# Patient Record
Sex: Female | Born: 1980
Health system: Southern US, Community
[De-identification: ages and names within clinical notes are randomized; demographics above are authoritative.]

## PROBLEM LIST (undated history)

## (undated) DIAGNOSIS — E785 Hyperlipidemia, unspecified: Secondary | ICD-10-CM

## (undated) DIAGNOSIS — Z8669 Personal history of other diseases of the nervous system and sense organs: Secondary | ICD-10-CM

## (undated) DIAGNOSIS — I10 Essential (primary) hypertension: Secondary | ICD-10-CM

## (undated) DIAGNOSIS — K219 Gastro-esophageal reflux disease without esophagitis: Secondary | ICD-10-CM

## (undated) HISTORY — DX: Personal history of other diseases of the nervous system and sense organs: Z86.69

## (undated) HISTORY — DX: Hyperlipidemia, unspecified: E78.5

## (undated) HISTORY — DX: Essential (primary) hypertension: I10

## (undated) HISTORY — DX: Gastro-esophageal reflux disease without esophagitis: K21.9

---

## 2010-11-18 HISTORY — PX: CHOLECYSTECTOMY: SHX55

## 2011-04-11 ENCOUNTER — Observation Stay: Payer: Self-pay | Admitting: Emergency Medicine

## 2011-08-15 ENCOUNTER — Ambulatory Visit: Payer: Self-pay

## 2011-11-19 HISTORY — PX: GASTRIC BYPASS: SHX52

## 2012-04-24 ENCOUNTER — Ambulatory Visit: Payer: Self-pay | Admitting: Bariatrics

## 2012-05-11 ENCOUNTER — Ambulatory Visit: Payer: Self-pay | Admitting: Bariatrics

## 2012-05-18 ENCOUNTER — Ambulatory Visit: Payer: Self-pay | Admitting: Bariatrics

## 2012-07-21 ENCOUNTER — Ambulatory Visit: Payer: Self-pay | Admitting: Bariatrics

## 2012-07-28 ENCOUNTER — Inpatient Hospital Stay: Payer: Self-pay | Admitting: Bariatrics

## 2012-07-28 LAB — PREGNANCY, URINE: Pregnancy Test, Urine: NEGATIVE m[IU]/mL

## 2012-07-29 LAB — CBC WITH DIFFERENTIAL/PLATELET
Basophil #: 0 10*3/uL (ref 0.0–0.1)
HCT: 40.6 % (ref 35.0–47.0)
Lymphocyte #: 0.9 10*3/uL — ABNORMAL LOW (ref 1.0–3.6)
Lymphocyte %: 5.4 %
MCHC: 33.5 g/dL (ref 32.0–36.0)
MCV: 92 fL (ref 80–100)
Monocyte %: 4.3 %
Neutrophil #: 14.8 10*3/uL — ABNORMAL HIGH (ref 1.4–6.5)
RDW: 12.8 % (ref 11.5–14.5)

## 2012-07-29 LAB — BASIC METABOLIC PANEL
Anion Gap: 7 (ref 7–16)
BUN: 8 mg/dL (ref 7–18)
Calcium, Total: 8.7 mg/dL (ref 8.5–10.1)
Chloride: 105 mmol/L (ref 98–107)
EGFR (Non-African Amer.): >60
Glucose: 138 mg/dL — ABNORMAL HIGH (ref 65–99)
Osmolality: 278 (ref 275–301)

## 2012-07-29 LAB — ALBUMIN: Albumin: 3.4 g/dL (ref 3.4–5.0)

## 2012-07-29 LAB — PHOSPHORUS: Phosphorus: 1.8 mg/dL — ABNORMAL LOW (ref 2.5–4.9)

## 2012-07-29 LAB — MAGNESIUM: Magnesium: 2 mg/dL

## 2012-08-28 ENCOUNTER — Ambulatory Visit: Payer: Self-pay | Admitting: General Practice

## 2012-09-18 ENCOUNTER — Ambulatory Visit: Payer: Self-pay | Admitting: General Practice

## 2012-11-18 HISTORY — PX: TONSILLECTOMY AND ADENOIDECTOMY: SUR1326

## 2012-12-25 ENCOUNTER — Ambulatory Visit: Payer: Self-pay | Admitting: Bariatrics

## 2013-04-08 IMAGING — RF DG UGI W/O KUB
8 series · 15 of 24 positions shown · non-contrast
Comparison: None

REASON FOR EXAM: abd pain Eval ulcer
COMMENTS:

PROCEDURE:     FL  - FL UPPER GI  - December 25, 2012  [DATE]
RESULT:     Indication: Abdominal pain

[Series 25: fluoro_barium 2fps_bw · 0.17mm/px · 2 of 3 frames shown (1 of 8)]
[frame 1/3]
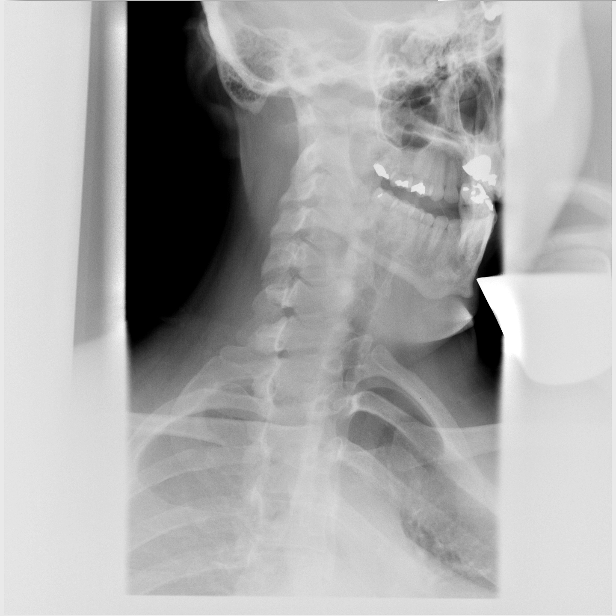
[frame 3/3]
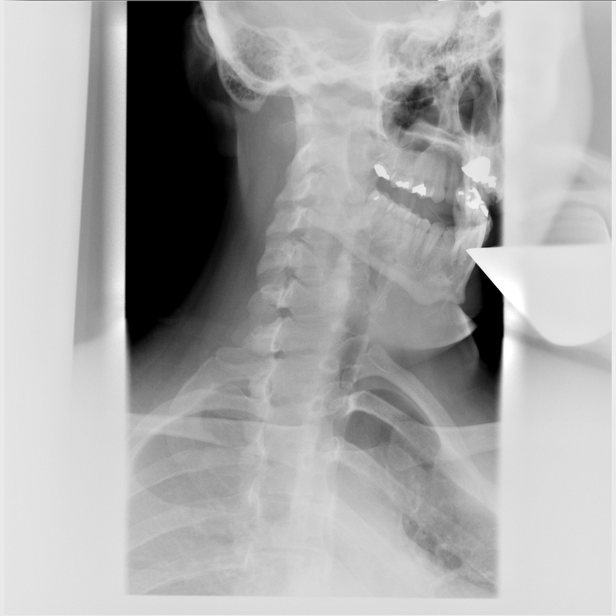

[Series 26: fluoro_barium 2fps_bw · 0.17mm/px · 2 of 39 frames shown (2 of 8)]
[frame 20/39]
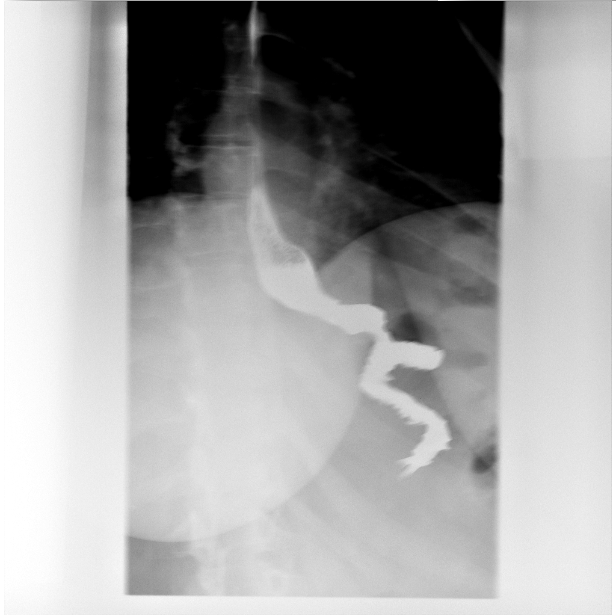
[frame 34/39]
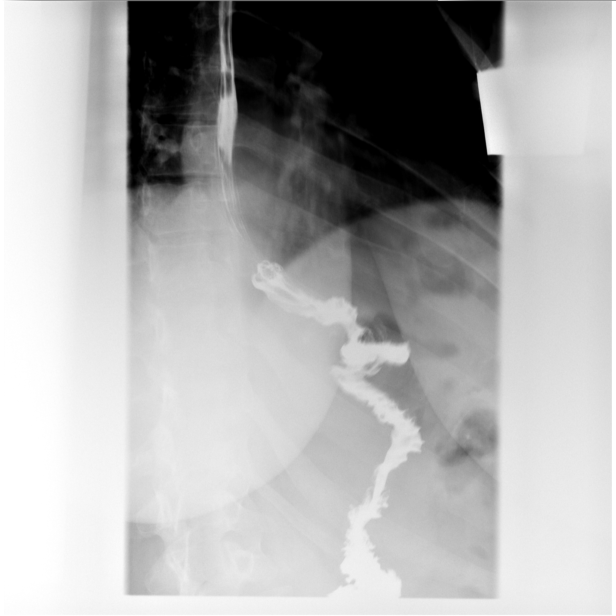

[Series 27: fluoro_barium 2fps_bw · 0.17mm/px · 2 of 40 frames shown (3 of 8)]
[frame 21/40]
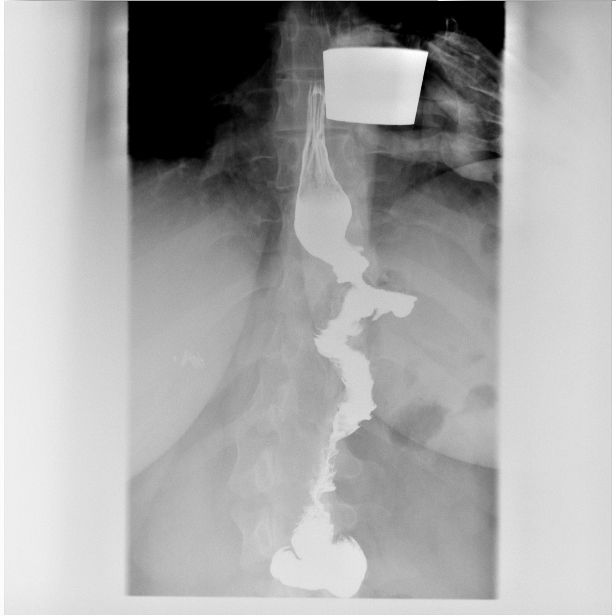
[frame 35/40]
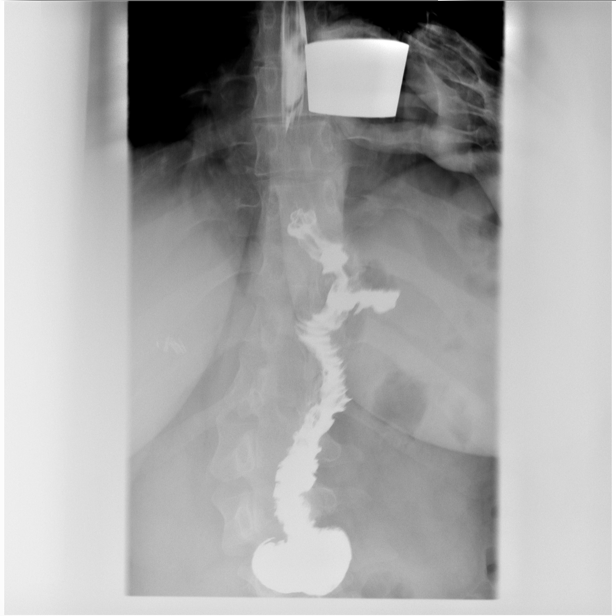

[Series 28: fluoro_barium 2fps_bw · 0.17mm/px · 2 of 33 frames shown (4 of 8)]
[frame 10/33]
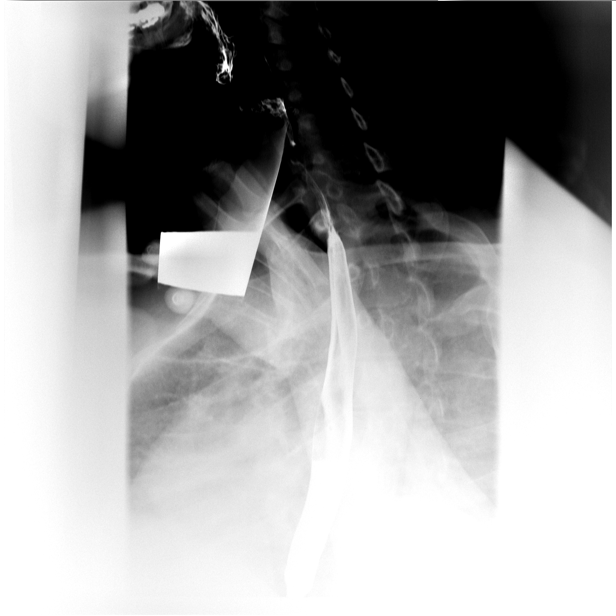
[frame 29/33]
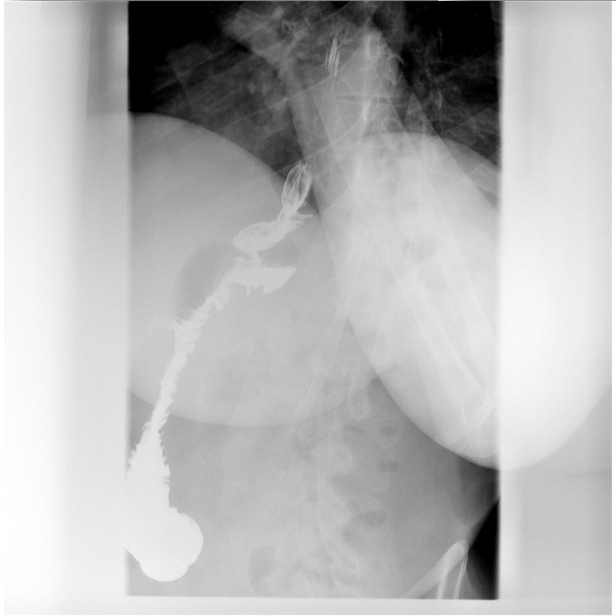

[Series 29: fluoro_barium 2fps_bw · 0.17mm/px · 1 of 2 frames shown (5 of 8)]
[frame 1/2]
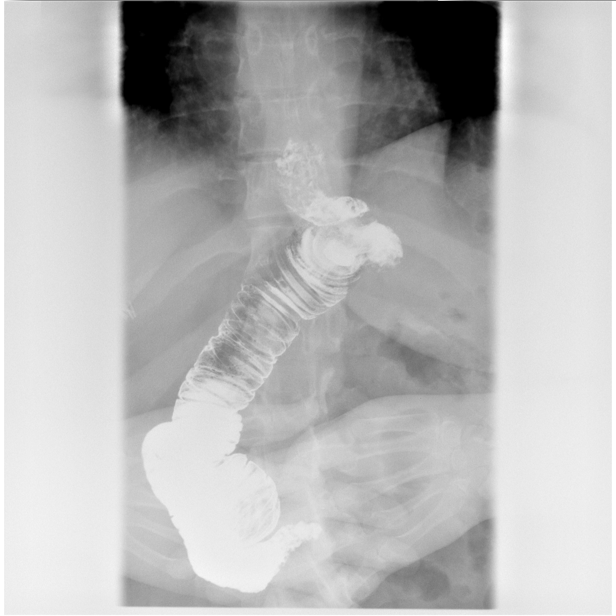

[Series 30: fluoro_barium 2fps_bw · 0.17mm/px · 2 of 40 frames shown (6 of 8)]
[frame 9/40]
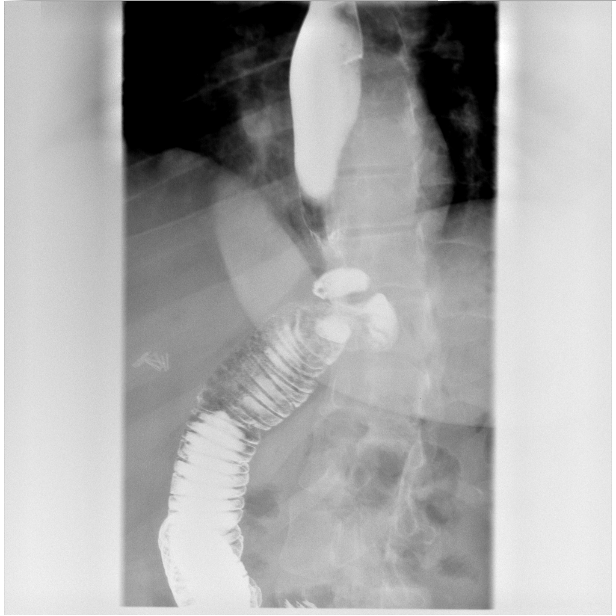
[frame 21/40]
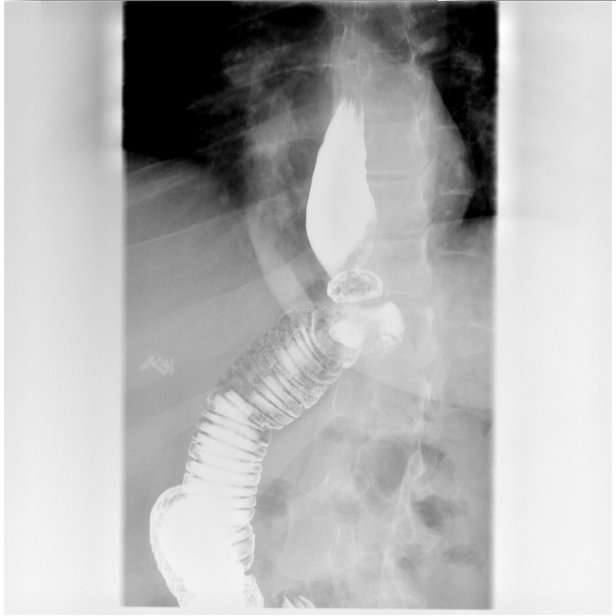

[Series 31: fluoro_barium 2fps_bw · 0.17mm/px · 2 of 40 frames shown (7 of 8)]
[frame 7/40]
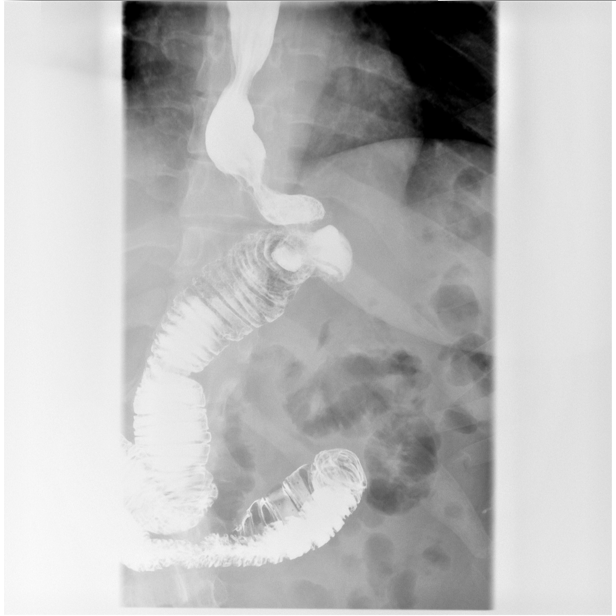
[frame 35/40]
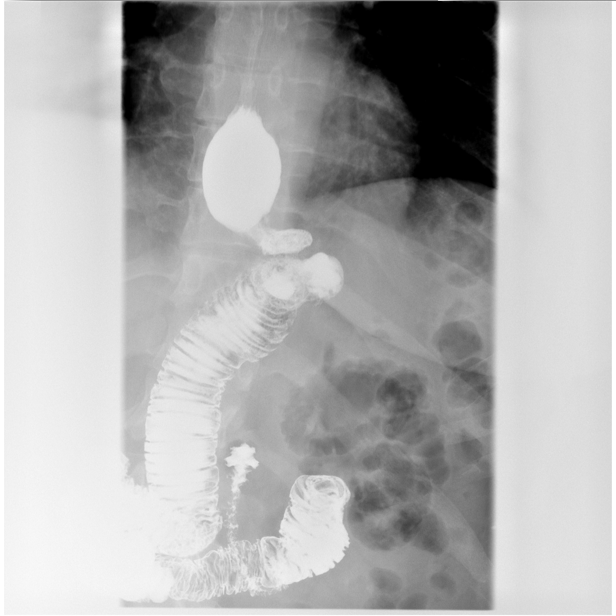

[Series 32: fluoro_barium 2fps_bw · 0.18mm/px · 2 of 13 frames shown (8 of 8)]
[frame 4/13]
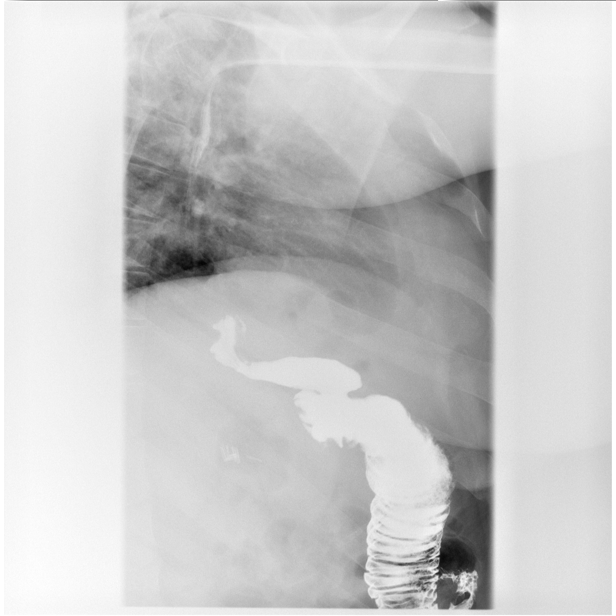
[frame 12/13]
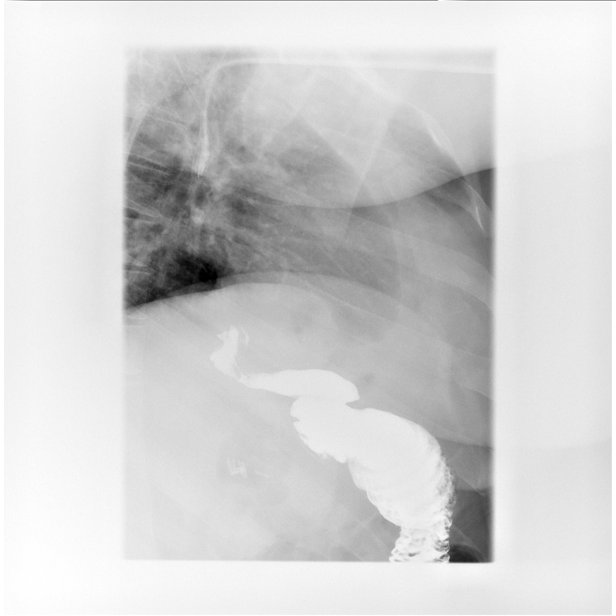

[15 of 24 positions shown; findings below may reference images not displayed]

FINDINGS: Biphasic examination of the esophagus to the distal duodenum was performed
without complication. Total fluoroscopy time was 0.6 minutes.

Examination of the esophagus demonstrated normal esophageal motility. Normal
esophageal morphology without evidence of esophagitis or ulceration. No
esophageal stricture, diverticula, or mass lesion. There is evidence of
gastric bypass surgery. There is no evidence of ulceration, scarring or mass
lesion. There is normal emptying of contrast into the proximal small bowel.
IMPRESSION: 1. Normal Upper GI status post gastric bypass.

[REDACTED]

## 2013-09-28 ENCOUNTER — Ambulatory Visit: Payer: Self-pay | Admitting: Otolaryngology

## 2013-09-30 LAB — HM PAP SMEAR: HM Pap smear: NORMAL

## 2014-03-23 ENCOUNTER — Encounter: Payer: Self-pay | Admitting: Adult Health

## 2014-03-23 ENCOUNTER — Encounter (INDEPENDENT_AMBULATORY_CARE_PROVIDER_SITE_OTHER): Payer: Self-pay

## 2014-03-23 ENCOUNTER — Ambulatory Visit (INDEPENDENT_AMBULATORY_CARE_PROVIDER_SITE_OTHER): Payer: 59 | Admitting: Adult Health

## 2014-03-23 VITALS — BP 117/75 | HR 68 | Temp 98.0°F | Resp 12 | Ht 67.5 in | Wt 181.2 lb

## 2014-03-23 DIAGNOSIS — J329 Chronic sinusitis, unspecified: Secondary | ICD-10-CM

## 2014-03-23 MED ORDER — AMOXICILLIN-POT CLAVULANATE 875-125 MG PO TABS: 1.0000 | ORAL_TABLET | Freq: Two times a day (BID) | ORAL | Status: DC

## 2014-03-23 NOTE — Progress Notes (Signed)
Pre visit review using our clinic review tool, if applicable. No additional management support is needed unless otherwise documented below in the visit note. 

## 2014-03-23 NOTE — Patient Instructions (Signed)
  Start Augmentin 1 tablet twice a day for 7 days.  For the cough take either Delsym or Robitussin. Make sure it contains guaifenesin or dextromethorphan.  Use your flonase nasal spray.  Drink plenty of fluids.  Afrin for severe nasal congestion. Only use this for 3 days.   Call if your symptoms do not improve.

## 2014-03-23 NOTE — Progress Notes (Signed)
Subjective:    Patient ID: Kaitlyn Schmitt, female    DOB: 06/28/1981, 33 y.o.   MRN: [id number]  HPI Pt is a pleasant 33 y/o female who presents to clinic to establish care. Will request her records. She is having coughing, sinus pressure, congestion, cough. Her symptoms have been going on for greater than 1 week. She has tried taking Nyquil, Dayquil. Low grade fever. She reports having symptoms such as these during the spring.  Pt is followed by OB/GYN - She is scheduling her PAP  She is due for her yearly physical exam and she will schedule that when she returns for the beach in approximately 3-4 weeks.   Past Medical History  Diagnosis Date  . GERD (gastroesophageal reflux disease)   . Hyperlipidemia   . Hypertension   . Hx of migraines      Past Surgical History  Procedure Laterality Date  . Cholecystectomy  2012  . Tonsillectomy and adenoidectomy  2014  . Gastric bypass  2013     Family History  Problem Relation Age of Onset  . Arthritis Father   . Hyperlipidemia Father   . Hypertension Father   . Arthritis Paternal Grandmother   . Heart disease Paternal Grandmother   . Hypertension Paternal Grandmother   . Diabetes Paternal Grandmother      History   Social History  . Marital Status: Single    Spouse Name: N/A    Number of Children: 1  . Years of Education: 12   Occupational History  . Medical Billing [employer]   Social History Main Topics  . Smoking status: Current Some Day Smoker  . Smokeless tobacco: Never Used  . Alcohol Use: Yes     Comment: socially  . Drug Use: No  . Sexual Activity: Not on file   Other Topics Concern  . Not on file   Social History Narrative   Kaitlyn Schmitt grew up in [place]. She is single and has a son Kaitlyn Alexanders(Justin). They have a dog (Zoe). 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 She spends most of her spare time with her son. She enjoys outdoor activities.      Caffeine - 20 oz daily   Exercise - Daily walking.             Review of Systems  Constitutional: Positive for fever and chills.  HENT: Positive for congestion, postnasal drip, rhinorrhea and sinus pressure. Sore throat: irritated throat.   Eyes: Negative.   Respiratory: Positive for cough and chest tightness.   Cardiovascular: Negative.   Gastrointestinal: Negative.   Endocrine: Negative.   Genitourinary: Negative.   Musculoskeletal: Negative.   Skin: Negative.   Allergic/Immunologic: Negative.   Neurological: Negative.   Hematological: Negative.   Psychiatric/Behavioral: Negative.        Objective:   Physical Exam  Constitutional: She is oriented to person, place, and time. No distress.  HENT:  Head: Normocephalic and atraumatic.  Right Ear: External ear normal.  Left Ear: External ear normal.  Pharyngeal irritation  Eyes: Conjunctivae and EOM are normal.  Neck: Normal range of motion. Neck supple.  Cardiovascular: Normal rate, regular rhythm, normal heart sounds and intact distal pulses.  Exam reveals no gallop and no friction rub.   No murmur heard. Pulmonary/Chest: Effort normal and breath sounds normal. No respiratory distress. She has no wheezes. She has no rales.  Musculoskeletal: Normal range of motion.  Lymphadenopathy:    She has no cervical adenopathy.  Neurological: She is  alert and oriented to person, place, and time. She has normal reflexes. Coordination normal.  Skin: Skin is warm and dry.  Psychiatric: She has a normal mood and affect. Her behavior is normal. Judgment and thought content normal.      Assessment & Plan:   1. Sinusitis Start Augmentin bid x 7 days. Delsym for cough. Flonase nasal spray. See pt instruction for full POC.

## 2014-09-23 DIAGNOSIS — M5416 Radiculopathy, lumbar region: Secondary | ICD-10-CM | POA: Insufficient documentation

## 2014-09-23 DIAGNOSIS — M5136 Other intervertebral disc degeneration, lumbar region: Secondary | ICD-10-CM | POA: Insufficient documentation

## 2014-09-23 DIAGNOSIS — M51369 Other intervertebral disc degeneration, lumbar region without mention of lumbar back pain or lower extremity pain: Secondary | ICD-10-CM | POA: Insufficient documentation

## 2014-09-30 ENCOUNTER — Encounter: Payer: Self-pay | Admitting: Internal Medicine

## 2014-09-30 ENCOUNTER — Ambulatory Visit (INDEPENDENT_AMBULATORY_CARE_PROVIDER_SITE_OTHER): Payer: 59 | Admitting: Internal Medicine

## 2014-09-30 VITALS — BP 112/72 | HR 69 | Temp 98.2°F | Resp 16 | Ht 68.25 in | Wt 172.0 lb

## 2014-09-30 DIAGNOSIS — G5601 Carpal tunnel syndrome, right upper limb: Secondary | ICD-10-CM

## 2014-09-30 DIAGNOSIS — Z23 Encounter for immunization: Secondary | ICD-10-CM

## 2014-09-30 DIAGNOSIS — Z9884 Bariatric surgery status: Secondary | ICD-10-CM

## 2014-09-30 DIAGNOSIS — I10 Essential (primary) hypertension: Secondary | ICD-10-CM

## 2014-09-30 DIAGNOSIS — R5383 Other fatigue: Secondary | ICD-10-CM

## 2014-09-30 DIAGNOSIS — F411 Generalized anxiety disorder: Secondary | ICD-10-CM

## 2014-09-30 MED ORDER — SERTRALINE HCL 100 MG PO TABS: 100.0000 mg | ORAL_TABLET | Freq: Every day | ORAL | Status: DC

## 2014-09-30 MED ORDER — ALPRAZOLAM 0.5 MG PO TABS: 0.5000 mg | ORAL_TABLET | Freq: Every evening | ORAL | Status: DC | PRN

## 2014-09-30 MED ORDER — LISINOPRIL 20 MG PO TABS: 20.0000 mg | ORAL_TABLET | Freq: Every day | ORAL | Status: DC

## 2014-09-30 NOTE — Progress Notes (Signed)
Pre-visit discussion using our clinic review tool. No additional management support is needed unless otherwise documented below in the visit note.  

## 2014-09-30 NOTE — Patient Instructions (Signed)
iT WAS VERY NICE MEETING YOU  We will call you with the results of your bloodwork today  Please plan to return in 6 months

## 2014-09-30 NOTE — Progress Notes (Signed)
Patient ID: Kaitlyn Schmitt, female   DOB: 03/02/1981, 33 y.o.   MRN: [id number]   Patient Active Problem List   Diagnosis Date Noted  . Carpal tunnel syndrome 10/02/2014  . S/P gastric bypass 09/30/2014  . Essential hypertension 09/30/2014  . Generalized anxiety disorder 09/30/2014  . Fatigue 09/30/2014    Subjective:  CC:   Chief Complaint  Patient presents with  . Annual Exam    [facility]    HPI:   Kaitlyn Schmitt is a 33 y.o. female who presents for follow up on multiple chronic issues.   History of Hypertension since  Adolosence.  Taking medications without side effects.   History of GERD uses prilosec ONCE DAILY FOR gerd  Obesity:  Underwent Gastric bypass 2 years ago .  Weight prior to surgery was 350 lbs reached nadir of 172 lbs,  Weight  Has plateaued.  Not exercising regularly .      GAD  MANAGED WITH ZOLOFT for years.  SEVERE anxiety since age 62 .  Uses xanax to help turn her brain off at night.  No history of panic attacks  But has had episodes of palpitations .  No history of domestic violence.  Uses it max once daily   Right forearm pain and numbness, very mild,  Not constant   Spends 8 hours daily using a keyboard.    Past Medical History  Diagnosis Date  . GERD (gastroesophageal reflux disease)   . Hyperlipidemia   . Hypertension   . Hx of migraines     Past Surgical History  Procedure Laterality Date  . Cholecystectomy  2012  . Tonsillectomy and adenoidectomy  2014  . Gastric bypass  2013       The following portions of the patient's history were reviewed and updated as appropriate: Allergies, current medications, and problem list.    Review of Systems:   Patient denies headache, fevers, malaise, unintentional weight loss, skin rash, eye pain, sinus congestion and sinus pain, sore throat, dysphagia,  hemoptysis , cough, dyspnea, wheezing, chest pain, palpitations, orthopnea, edema, abdominal pain,  nausea, melena, diarrhea, constipation, flank pain, dysuria, hematuria, urinary  Frequency, nocturia, numbness, tingling, seizures,  Focal weakness, Loss of consciousness,  Tremor, insomnia, depression, anxiety, and suicidal ideation.     History   Social History  . Marital Status: Single    Spouse Name: N/A    Number of Children: 1  . Years of Education: 12   Occupational History  . [employer]   Social History Main Topics  . Smoking status: Former Smoker    Quit date: 05/18/2014  . Smokeless tobacco: Never Used  . Alcohol Use: 0.0 oz/week    0 Not specified per week     Comment: socially  . Drug Use: No  . Sexual Activity: Yes   Other Topics Concern  . Not on file   Social History Narrative   Sheridan grew up in [place]. She is single and has a son Larkin Ina). They have a dog (Zoe). She works in Press photographer at [employer]. She spends most of her spare time with her son. She enjoys outdoor activities.      Caffeine - 20 oz daily   Exercise - Daily walking.           Objective:  Filed Vitals:   09/30/14 1503  BP: 112/72  Pulse: 69  Temp: 98.2 F (36.8 C)  Resp: 16  General appearance: alert, cooperative and appears stated age Ears: normal TM's and external ear canals both ears Throat: lips, mucosa, and tongue normal; teeth and gums normal Neck: no adenopathy, no carotid bruit, supple, symmetrical, trachea midline and thyroid not enlarged, symmetric, no tenderness/mass/nodules Back: symmetric, no curvature. ROM normal. No CVA tenderness. Lungs: clear to auscultation bilaterally Heart: regular rate and rhythm, S1, S2 normal, no murmur, click, rub or gallop Abdomen: soft, non-tender; bowel sounds normal; no masses,  no organomegaly Pulses: 2+ and symmetric Skin: Skin color, texture, turgor normal. No rashes or lesions Lymph nodes: Cervical, supraclavicular, and axillary nodes normal.  Assessment and Plan:  Essential hypertension Well  controlled on current regimen. Renal function stable, and potassium is normal despite concurrent use of lisinopril and spironolactone. no changes today.  Lab Results  Component Value Date   CREATININE 0.68 09/30/2014   Lab Results  Component Value Date   NA 139 09/30/2014   K 4.8 09/30/2014   CL 100 09/30/2014   CO2 22 09/30/2014     S/P gastric bypass I have congratulated her in reduction of   BMI and encouraged  Continued weight management with low glycemic index diet and regular exercise a minimum of 5 days per week. . Mg, albumin,  Vit D and B12 levels are all normal.   Fatigue Will rule out thyroid hypofunction  Lab Results  Component Value Date   TSH 1.290 09/30/2014    Carpal tunnel syndrome Suggested by her history of right wrist numbness which is currently intermittent and aggravated by keyboard use,  Suggest getting a wrist pain to maintain neutral position.    Updated Medication List Outpatient Encounter Prescriptions as of 09/30/2014  Medication Sig  . ALPRAZolam (XANAX) 0.5 MG tablet Take 1 tablet (0.5 mg total) by mouth at bedtime as needed for anxiety.  Marland Kitchen lisinopril (PRINIVIL,ZESTRIL) 20 MG tablet Take 1 tablet (20 mg total) by mouth daily.  Marland Kitchen omeprazole (PRILOSEC) 10 MG capsule Take 10 mg by mouth daily.  . sertraline (ZOLOFT) 100 MG tablet Take 1 tablet (100 mg total) by mouth daily.  . [DISCONTINUED] ALPRAZolam (XANAX) 0.5 MG tablet Take 0.5 mg by mouth at bedtime as needed for anxiety.  . [DISCONTINUED] lisinopril (PRINIVIL,ZESTRIL) 20 MG tablet Take 20 mg by mouth daily.  . [DISCONTINUED] sertraline (ZOLOFT) 100 MG tablet Take 100 mg by mouth daily.  . [DISCONTINUED] sertraline (ZOLOFT) 100 MG tablet Take 1 tablet (100 mg total) by mouth daily.  Marland Kitchen amoxicillin-clavulanate (AUGMENTIN) 875-125 MG per tablet Take 1 tablet by mouth 2 (two) times daily.  Marland Kitchen spironolactone (ALDACTONE) 50 MG tablet Take 50 mg by mouth daily.     Orders Placed This Encounter   Procedures  . Tdap vaccine greater than or equal to 7yo IM  . HM PAP SMEAR  . Comp Met (CMET)  . Magnesium  . TSH  . Vitamin D (25 hydroxy)  . B12    Return in about 6 months (around 03/31/2015).

## 2014-10-01 LAB — COMPREHENSIVE METABOLIC PANEL
ALT: 14 IU/L (ref 0–32)
AST: 12 IU/L (ref 0–40)
Albumin/Globulin Ratio: 1.8 (ref 1.1–2.5)
Albumin: 4.5 g/dL (ref 3.5–5.5)
Alkaline Phosphatase: 48 IU/L (ref 39–117)
BUN/Creatinine Ratio: 19 (ref 8–20)
BUN: 13 mg/dL (ref 6–20)
CO2: 22 mmol/L (ref 18–29)
Calcium: 9.6 mg/dL (ref 8.7–10.2)
Chloride: 100 mmol/L (ref 97–108)
Creatinine, Ser: 0.68 mg/dL (ref 0.57–1.00)
GFR calc Af Amer: 133 mL/min/{1.73_m2} (ref >59)
GFR, EST NON AFRICAN AMERICAN: 115 mL/min/{1.73_m2} (ref >59)
GLUCOSE: 83 mg/dL (ref 65–99)
Globulin, Total: 2.5 g/dL (ref 1.5–4.5)
Potassium: 4.8 mmol/L (ref 3.5–5.2)
Sodium: 139 mmol/L (ref 134–144)
Total Bilirubin: 0.3 mg/dL (ref 0.0–1.2)
Total Protein: 7 g/dL (ref 6.0–8.5)

## 2014-10-01 LAB — MAGNESIUM: Magnesium: 2.1 mg/dL (ref 1.6–2.6)

## 2014-10-01 LAB — VITAMIN D 25 HYDROXY (VIT D DEFICIENCY, FRACTURES): Vit D, 25-Hydroxy: 39.5 ng/mL (ref 30.0–100.0)

## 2014-10-01 LAB — VITAMIN B12: Vitamin B-12: 939 pg/mL (ref 211–946)

## 2014-10-01 LAB — TSH: TSH: 1.29 u[IU]/mL (ref 0.450–4.500)

## 2014-10-02 ENCOUNTER — Encounter: Payer: Self-pay | Admitting: Internal Medicine

## 2014-10-02 DIAGNOSIS — G5600 Carpal tunnel syndrome, unspecified upper limb: Secondary | ICD-10-CM | POA: Insufficient documentation

## 2014-10-02 NOTE — Assessment & Plan Note (Signed)
Will rule out thyroid hypofunction  Lab Results  Component Value Date   TSH 1.290 09/30/2014

## 2014-10-02 NOTE — Assessment & Plan Note (Signed)
Suggested by her history of right wrist numbness which is currently intermittent and aggravated by keyboard use,  Suggest getting a wrist pain to maintain neutral position.

## 2014-10-02 NOTE — Assessment & Plan Note (Addendum)
Well controlled on current regimen. Renal function stable, and potassium is normal despite concurrent use of lisinopril and spironolactone. no changes today.  Lab Results  Component Value Date   CREATININE 0.68 09/30/2014   Lab Results  Component Value Date   NA 139 09/30/2014   K 4.8 09/30/2014   CL 100 09/30/2014   CO2 22 09/30/2014

## 2014-10-02 NOTE — Assessment & Plan Note (Addendum)
I have congratulated her in reduction of   BMI and encouraged  Continued weight management with low glycemic index diet and regular exercise a minimum of 5 days per week. . Mg, albumin,  Vit D and B12 levels are all normal.

## 2014-10-07 ENCOUNTER — Encounter: Payer: Self-pay | Admitting: Internal Medicine

## 2015-01-16 ENCOUNTER — Other Ambulatory Visit: Payer: Self-pay | Admitting: *Deleted

## 2015-01-16 ENCOUNTER — Other Ambulatory Visit: Payer: Self-pay | Admitting: Internal Medicine

## 2015-02-14 ENCOUNTER — Encounter: Payer: Self-pay | Admitting: Internal Medicine

## 2015-03-07 NOTE — Op Note (Signed)
PATIENT NAME:  Kaitlyn Schmitt, LAWNICZAK MR#:  [id number] DATE OF BIRTH:  05-23-1981  DATE OF PROCEDURE:  07/28/2012  PROCEDURES PERFORMED:  1. Laparoscopic gastric bypass with repair of hiatal hernia.  2. Intraoperative endoscopy.   SURGEON: Tyrone Apple. Kanoelani Dobies, MD  ASSISTANT: Sanjuana Kava, PA   PREOPERATIVE DIAGNOSES: Morbid obesity with a BMI of 48 associated with hypertension and obstructive sleep apnea, also, hyperlipidemia.  POSTOPERATIVE DIAGNOSES: Morbid obesity with a BMI of 48 associated with hypertension and obstructive sleep apnea, also, hyperlipidemia  with presence of a small hiatal hernia.   DESCRIPTION OF PROCEDURE: The patient was brought to the Operating Room, placed in supine position. General anesthesia obtained with orotracheal intubation. A Foley catheter inserted sterilely and TED hose and Thromboguards applied. A foot board placed at the end of the operative bed. The patient's lower chest and abdomen sterilely prepped and draped. A 5 mm Optiview trocar introduced under direct visualization in the left upper quadrant of the abdomen. Insufflation with carbon dioxide performed. Patient then had placement of four additional trocars across the upper abdomen under direct visualization. The omentum was elevated out of the lower abdomen and placed in the subhepatic space and the ligament of Treitz identified. The bowel was followed distally 50 cm at which point it was divided with a white load GIA stapler. The arcade vessels divided by way of use of the Harmonic scalpel. The small bowel was then followed distally an additional 100 cm at which point a side-to-side jejunal/jejunal anastomosis configured. This was accomplished with a white load stapler introduced through enterotomies on the antimesenteric border of the biliary limb and on the common channel portion of jejunum. The resulting enterotomy closed with a repeat firing of white load GI stapler. The patient had anti- torsion sutures placed  distal to the anastomosis and the mesenteric window closed with a running 2-0 Ethibond suture. The transverse colon omentum was divided by use of the Harmonic scalpel and the previously created Roux limb was placed in an antecolic position and secured to the anterior gastric wall. A Nathanson liver retractor introduced through a subxiphoid wound under direct visualization and the left lobe of the liver elevated. Patient noted to have a small hiatal hernia. This was repaired by division of portions of the gastrohepatic ligament which was divided by use of the Harmonic scalpel. The herniated peritoneum was incised along the anterior aspect of the hiatus and blunt dissection was then used to create a preperitoneal plane in the lower mediastinum separating herniated peritoneum away from the overlying pericardium. The distal esophagus mobilized from the overlying pericardium. The patient had division of portions of the phrenoesophageal ligaments by use of the Harmonic scalpel and also the peritoneum was incised along the right crus. Herniated lesser sac fatty tissue returned to the abdominal cavity. Circumferential dissection of the lower esophagus then performed freeing up from the underlying aorta and the pleural surfaces on both the right and left side. This was extended into the lower mediastinum over a distance of approximately 6 cm. Ultimately this resulted in delivery of approximately 2 cm of esophagus lying comfortably in the abdominal cavity. A posterior crural repair was then performed with two interrupted 0 Ethibond sutures. Next, an area was chosen approximately 5 cm inferior to the GE junction for creation of a proximal gastric pouch. This was accomplished with division of the arcade vessels along the lesser curvature of the stomach with use of the Harmonic scalpel. Next, a gold load staple with SEAMGUARD reinforcement was  used to begin a staple line in a transverse direction. Vertical line of staples was  then placed parallel to the lesser curvature and brought out just lateral to the angle of His. The prior divided Roux limb was then brought into approximation with a posterior distal aspect of the gastric pouch and a gastrojejunal anastomosis configured. This was accomplished with enterotomies on the distal posterior aspect of the stomach pouch and also on the antimesenteric border of the Roux limb. A small candy cane effect was created to take tension off the mesentery. A blue load stapler was fired at the 2 cm mark to create a common lumen and the resulting enterotomy closed with a running 2-0 Vicryl suture performed over 34 French bougie which was used as an aid in sizing of the anastomotic lumen. The suture and staple lines then reinforced with an additional running 2-0 Vicryl suture. Intraoperative endoscopy performed. There was no evidence of an air leak noted with a saline bath. The prior divided omentum was then positioned over the area of the anastomosis and secured with 2-0 Ethibond suture and Peterson's defect was closed with a running 2-0 Ethibond suture. At this point, the jejunal-jejunal anastomosis inspected for hemostasis and found to be adequate. The pneumoperitoneum relieved. The trocars removed. The wounds injected with 0.25% Marcaine and closed with 4-0 Monocryl in the dermis followed by Dermabond. d   ____________________________ Tyrone AppleMichael A. Alva Garnetyner, MD mat:cms D: 07/29/2012 22:13:11 ET T: 07/30/2012 10:00:36 ET  JOB#: [id number] cc: Casimiro NeedleMichael A. Alva Garnetyner, MD, <Dictator> Everette RankMICHAEL A Maicey Barrientez MD ELECTRONICALLY SIGNED 08/12/2012 20:00

## 2015-03-20 ENCOUNTER — Other Ambulatory Visit: Payer: Self-pay | Admitting: Internal Medicine

## 2015-03-20 ENCOUNTER — Other Ambulatory Visit: Payer: Self-pay | Admitting: *Deleted

## 2015-03-20 MED ORDER — ALPRAZOLAM 0.5 MG PO TABS: 0.5000 mg | ORAL_TABLET | Freq: Every evening | ORAL | Status: DC | PRN

## 2015-03-20 NOTE — Telephone Encounter (Signed)
90 day supply authorized and printed  

## 2015-03-20 NOTE — Telephone Encounter (Signed)
Last OV 09/30/14, has appt scheduled 03/31/15, ok refill?

## 2015-03-20 NOTE — Telephone Encounter (Signed)
Faxed to pharmacy

## 2015-03-20 NOTE — Telephone Encounter (Signed)
Appt 5.13.16.

## 2015-03-31 ENCOUNTER — Ambulatory Visit: Payer: 59 | Admitting: Internal Medicine

## 2015-05-05 ENCOUNTER — Ambulatory Visit: Payer: Self-pay | Admitting: Internal Medicine

## 2015-05-12 ENCOUNTER — Ambulatory Visit (INDEPENDENT_AMBULATORY_CARE_PROVIDER_SITE_OTHER): Payer: 59 | Admitting: Internal Medicine

## 2015-05-12 ENCOUNTER — Telehealth: Payer: Self-pay | Admitting: *Deleted

## 2015-05-12 ENCOUNTER — Encounter: Payer: Self-pay | Admitting: Internal Medicine

## 2015-05-12 VITALS — BP 120/78 | HR 80 | Temp 98.5°F | Resp 14 | Ht 68.0 in | Wt 199.2 lb

## 2015-05-12 DIAGNOSIS — E669 Obesity, unspecified: Secondary | ICD-10-CM | POA: Insufficient documentation

## 2015-05-12 DIAGNOSIS — R200 Anesthesia of skin: Secondary | ICD-10-CM

## 2015-05-12 DIAGNOSIS — M25532 Pain in left wrist: Secondary | ICD-10-CM | POA: Diagnosis not present

## 2015-05-12 DIAGNOSIS — M25531 Pain in right wrist: Secondary | ICD-10-CM

## 2015-05-12 DIAGNOSIS — R208 Other disturbances of skin sensation: Secondary | ICD-10-CM | POA: Diagnosis not present

## 2015-05-12 DIAGNOSIS — G5601 Carpal tunnel syndrome, right upper limb: Secondary | ICD-10-CM | POA: Diagnosis not present

## 2015-05-12 DIAGNOSIS — G5603 Carpal tunnel syndrome, bilateral upper limbs: Secondary | ICD-10-CM

## 2015-05-12 DIAGNOSIS — G5602 Carpal tunnel syndrome, left upper limb: Secondary | ICD-10-CM

## 2015-05-12 MED ORDER — CELECOXIB 400 MG PO CAPS: 400.0000 mg | ORAL_CAPSULE | Freq: Every day | ORAL | Status: DC

## 2015-05-12 MED ORDER — CYCLOBENZAPRINE HCL 5 MG PO TABS: 5.0000 mg | ORAL_TABLET | Freq: Three times a day (TID) | ORAL | Status: DC | PRN

## 2015-05-12 NOTE — Telephone Encounter (Signed)
Fax from pharmacy, needing PA for Celebrex. Started online, pending response.  

## 2015-05-12 NOTE — Patient Instructions (Signed)
I am ordering nerve conduction studies to rule out Carpal Tunnel Syndrome  Do NOT wear the braces during your workday,  Wait until you are home  changing ibuprofen to Celebrex once daily after breakfast   You can use tylenol 1000 mg and flexeril 5 mg at night to help you rest

## 2015-05-12 NOTE — Assessment & Plan Note (Signed)
Her BMI is now 30 after reducing it to 25 with gastric bypass.  I have addressed  BMI and recommended wt loss of 10% of body weight over the next 6 months using a low glycemic index diet and regular exercise a minimum of 5 days per week.

## 2015-05-12 NOTE — Progress Notes (Signed)
Subjective:  Patient ID: Kaitlyn Schmitt, female    DOB: 29-Apr-1981  Age: 34 y.o. MRN: [id number]  CC: The primary encounter diagnosis was Bilateral wrist pain. Diagnoses of Numbness of fingers of both hands, Obesity, and Bilateral carpal tunnel syndrome were also pertinent to this visit.  HPI Kaitlyn Schmitt presents for worsening bilateral  wrist pain .  Seen in November,  Pain was mild and consistent with CTS.  She uses Kaitlyn Schmitt 8 houor daily , has been trying to wear her father's right wrist brace daily while typing .Marland Kitchen  Has been using tylenol and ibuprofen in the am 400 mg  , and 400 mg at night time. Wakes up hurting Kaitlyn lot,      Outpatient Prescriptions Prior to Visit  Medication Sig Dispense Refill  . ALPRAZolam (XANAX) 0.5 MG tablet Take 1 tablet (0.5 mg total) by mouth at bedtime as needed for anxiety. 90 tablet 1  . lisinopril (PRINIVIL,ZESTRIL) 20 MG tablet Take 1 tablet by mouth  daily 90 tablet 1  . omeprazole (PRILOSEC) 10 MG capsule Take 10 mg by mouth daily.    . sertraline (ZOLOFT) 100 MG tablet Take 1 tablet by mouth  daily 90 tablet 1  . spironolactone (ALDACTONE) 50 MG tablet Take 50 mg by mouth daily.    Marland Kitchen amoxicillin-clavulanate (AUGMENTIN) 875-125 MG per tablet Take 1 tablet by mouth 2 (two) times daily. 14 tablet 0   No facility-administered medications prior to visit.    Review of Systems;  Patient denies headache, fevers, malaise, unintentional weight loss, skin rash, eye pain, sinus congestion and sinus pain, sore throat, dysphagia,  hemoptysis , cough, dyspnea, wheezing, chest pain, palpitations, orthopnea, edema, abdominal pain, nausea, melena, diarrhea, constipation, flank pain, dysuria, hematuria, urinary  Frequency, nocturia, numbness, tingling, seizures,  Focal weakness, Loss of consciousness,  Tremor, insomnia, depression, anxiety, and suicidal ideation.      Objective:  BP 120/78 mmHg  Pulse 80  Temp(Src) 98.5 F (36.9 C) (Oral)  Resp 14  Ht 5'  8" (1.727 m)  Wt 199 lb 4 oz (90.379 kg)  BMI 30.30 kg/m2  SpO2 99%  BP Readings from Last 3 Encounters:  05/12/15 120/78  09/30/14 112/72  03/23/14 117/75    Wt Readings from Last 3 Encounters:  05/12/15 199 lb 4 oz (90.379 kg)  09/30/14 172 lb (78.019 kg)  03/23/14 181 lb 4 oz (82.214 kg)    General appearance: alert, cooperative and appears stated age Ears: normal TM's and external ear canals both ears Throat: lips, mucosa, and tongue normal; teeth and gums normal Neck: no adenopathy, no carotid bruit, supple, symmetrical, trachea midline and thyroid not enlarged, symmetric, no tenderness/mass/nodules Back: symmetric, no curvature. ROM normal. No CVA tenderness. Lungs: clear to auscultation bilaterally Heart: regular rate and rhythm, S1, S2 normal, no murmur, click, rub or gallop Abdomen: soft, non-tender; bowel sounds normal; no masses,  no organomegaly Pulses: 2+ and symmetric Skin: Skin color, texture, turgor normal. No rashes or lesions Lymph nodes: Cervical, supraclavicular, and axillary nodes normal.  No results found for: HGBA1C  Lab Results  Component Value Date   CREATININE 0.68 09/30/2014   CREATININE 0.75 07/29/2012    Lab Results  Component Value Date   WBC 16.4* 07/29/2012   HGB 13.6 07/29/2012   HCT 40.6 07/29/2012   PLT 298 07/29/2012   GLUCOSE 83 09/30/2014   ALT 14 09/30/2014   AST 12 09/30/2014   NA 139 09/30/2014   K 4.8 09/30/2014  CL 100 09/30/2014   CREATININE 0.68 09/30/2014   BUN 13 09/30/2014   CO2 22 09/30/2014   TSH 1.290 09/30/2014    No results found.  Assessment & Plan:   Problem List Items Addressed This Visit      Unprioritized   Carpal tunnel syndrome    Bilateral,  Right greater than left  New splints given,  Advised to use them only at night .  EMG nerve conduction studies advised.  Continue NSAIDs      Relevant Medications   cyclobenzaprine (FLEXERIL) 5 MG tablet   Obesity    Her BMI is now 30 after reducing  it to 25 with gastric bypass.  I have addressed  BMI and recommended wt loss of 10% of body weight over the next 6 months using Kaitlyn low glycemic index diet and regular exercise Kaitlyn minimum of 5 days per week.         Other Visit Diagnoses    Bilateral wrist pain    -  Primary    Relevant Orders    Ambulatory referral to Neurology    Numbness of fingers of both hands        Relevant Orders    Ambulatory referral to Neurology       I have discontinued Kaitlyn Schmitt's amoxicillin-clavulanate. I am also having her start on celecoxib and cyclobenzaprine. Additionally, I am having her maintain her spironolactone, omeprazole, sertraline, lisinopril, ALPRAZolam, and HYDROcodone-acetaminophen.  Meds ordered this encounter  Medications  . HYDROcodone-acetaminophen (NORCO/VICODIN) 5-325 MG per tablet    Sig: Take 1 tablet by mouth 2 (two) times daily as needed. For Back pain  . celecoxib (CELEBREX) 400 MG capsule    Sig: Take 1 capsule (400 mg total) by mouth daily after breakfast.    Dispense:  30 capsule    Refill:  3  . cyclobenzaprine (FLEXERIL) 5 MG tablet    Sig: Take 1 tablet (5 mg total) by mouth 3 (three) times daily as needed for muscle spasms.    Dispense:  30 tablet    Refill:  1    Medications Discontinued During This Encounter  Medication Reason  . amoxicillin-clavulanate (AUGMENTIN) 875-125 MG per tablet Completed Course    Follow-up: No Follow-up on file.   Kaitlyn Shams, MD

## 2015-05-15 NOTE — Assessment & Plan Note (Signed)
Bilateral,  Right greater than left  New splints given,  Advised to use them only at night .  EMG nerve conduction studies advised.  Continue NSAIDs

## 2015-05-15 NOTE — Telephone Encounter (Signed)
Fax from [place], [place] approved through 11/11/15.

## 2015-07-07 ENCOUNTER — Ambulatory Visit: Payer: 59 | Admitting: Neurology

## 2015-07-25 ENCOUNTER — Other Ambulatory Visit: Payer: Self-pay | Admitting: Internal Medicine

## 2015-09-05 ENCOUNTER — Other Ambulatory Visit: Payer: Self-pay

## 2015-09-05 ENCOUNTER — Other Ambulatory Visit: Payer: Self-pay | Admitting: Internal Medicine

## 2015-09-05 NOTE — Telephone Encounter (Signed)
Please advise refill, last OV was 05/12/15.

## 2015-09-06 MED ORDER — ALPRAZOLAM 0.5 MG PO TABS: 0.5000 mg | ORAL_TABLET | Freq: Every evening | ORAL | Status: DC | PRN

## 2015-09-06 NOTE — Telephone Encounter (Signed)
90 day supply authorized and printed  

## 2015-09-15 ENCOUNTER — Ambulatory Visit: Payer: 59 | Admitting: Neurology

## 2015-12-04 ENCOUNTER — Other Ambulatory Visit: Payer: Self-pay

## 2015-12-04 MED ORDER — ALPRAZOLAM 0.5 MG PO TABS: 0.5000 mg | ORAL_TABLET | Freq: Every evening | ORAL | Status: DC | PRN

## 2015-12-07 MED ORDER — ALPRAZOLAM 0.5 MG PO TABS: 0.5000 mg | ORAL_TABLET | Freq: Every evening | ORAL | Status: DC | PRN

## 2015-12-07 NOTE — Addendum Note (Signed)
Addended by: Acey Lav on: 12/07/2015 08:48 AM   Modules accepted: Orders

## 2015-12-07 NOTE — Telephone Encounter (Signed)
Script faxed to [facility].

## 2016-01-21 ENCOUNTER — Other Ambulatory Visit: Payer: Self-pay | Admitting: Internal Medicine

## 2016-01-22 NOTE — Telephone Encounter (Signed)
Last OV 6/16 ok to fill? 

## 2016-01-22 NOTE — Telephone Encounter (Signed)
Refill for 90 day, but .  OFFICE VISIT NEEDED prior to any more refills MyChart message sent

## 2016-02-05 NOTE — Telephone Encounter (Signed)
Mailed unread message to patient.  

## 2016-03-27 ENCOUNTER — Ambulatory Visit (INDEPENDENT_AMBULATORY_CARE_PROVIDER_SITE_OTHER): Payer: 59 | Admitting: Internal Medicine

## 2016-03-27 ENCOUNTER — Encounter: Payer: Self-pay | Admitting: Internal Medicine

## 2016-03-27 VITALS — BP 128/78 | HR 94 | Temp 98.4°F | Resp 12 | Ht 68.0 in | Wt 212.8 lb

## 2016-03-27 DIAGNOSIS — R635 Abnormal weight gain: Secondary | ICD-10-CM

## 2016-03-27 DIAGNOSIS — F411 Generalized anxiety disorder: Secondary | ICD-10-CM | POA: Diagnosis not present

## 2016-03-27 DIAGNOSIS — E669 Obesity, unspecified: Secondary | ICD-10-CM | POA: Diagnosis not present

## 2016-03-27 DIAGNOSIS — Z9884 Bariatric surgery status: Secondary | ICD-10-CM

## 2016-03-27 DIAGNOSIS — I10 Essential (primary) hypertension: Secondary | ICD-10-CM

## 2016-03-27 DIAGNOSIS — E559 Vitamin D deficiency, unspecified: Secondary | ICD-10-CM

## 2016-03-27 MED ORDER — SERTRALINE HCL 100 MG PO TABS: ORAL_TABLET | ORAL | Status: DC

## 2016-03-27 NOTE — Patient Instructions (Addendum)
For your allergies,  tyr one of these:  generic zyrtec, which is cetirizine.  Allegra is available generically as fexofenadine and it comes in 60 mg and 180 mg once daily strengths.  The claritin is also available generically as loratidine.   Your BMI is currently 32.  (Obesity is diagnosed at a BMI of 30).  I want you to lose weight  Goal is 195 lbs (BMI < 30) .   You might want to try a premixed protein drink called Premier Protein shake in the morning.  It is less $$$ and very low sugar.    160 cal  30 g protein  1 g sugar 50% calcium needs

## 2016-03-27 NOTE — Progress Notes (Signed)
Subjective:  Patient ID: Kaitlyn Schmitt, female    DOB: 1981/09/12  Age: 35 y.o. MRN: [id number]  CC: The primary encounter diagnosis was Weight gain. Diagnoses of Gastric bypass status for obesity, Obesity, Generalized anxiety disorder, Essential hypertension, S/P gastric bypass, and Vitamin D deficiency were also pertinent to this visit.  HPI JAILA SCHELLHORN presents for follow up on hypertension , GAD managed with zoloft and alprazolam at bedtime   Still dealing daily with anxiety . Feels it is constant.  She is having trouble with time management  Complicated by planning her wedding.  She has several  Children, her fiance's 2 plus her 22 yr old.  Fiance and family are very supportive. Not using alprazolam  except for sleep.  Too sedating to use during the day.  Uses it to turn off a racing mind  at night without it. Wants to continue zoloft.  Feels a big difference  Without it . She works at [employer] in [place].  7:30 to 4:00 pm     Obesity:  She has gained 40 lbs since reaching her a nadir of 172 lbs in Nov 2015 after undergoing bariatric surgery.  (BMI was 26,  Now 32) "I'm comfortable with (my weight)."  She is not exercising, except for walking her dog for exercise once a day less than 30 minutes    Hypertension:  She is tolerating lisinopril without cough.   Labs today .  She is not fasting,  Ate bbq and slaw for lunch. Marland Kitchen   HM:  She is wearing an IUD,  sees GYN       Outpatient Prescriptions Prior to Visit  Medication Sig Dispense Refill  . ALPRAZolam (XANAX) 0.5 MG tablet Take 1 tablet (0.5 mg total) by mouth at bedtime as needed for anxiety. 90 tablet 0  . HYDROcodone-acetaminophen (NORCO/VICODIN) 5-325 MG per tablet Take 1 tablet by mouth 2 (two) times daily as needed. Reported on 03/27/2016    . lisinopril (PRINIVIL,ZESTRIL) 20 MG tablet Take 1 tablet by mouth  daily 90 tablet 2  . omeprazole (PRILOSEC) 10 MG capsule Take 10 mg by mouth daily. Reported on 03/27/2016    .  sertraline (ZOLOFT) 100 MG tablet Take 1 tablet by mouth  daily 90 tablet 0  . spironolactone (ALDACTONE) 50 MG tablet Take 50 mg by mouth daily. Reported on 03/27/2016    . celecoxib (CELEBREX) 400 MG capsule Take 1 capsule (400 mg total) by mouth daily after breakfast. (Patient not taking: Reported on 03/27/2016) 30 capsule 3  . cyclobenzaprine (FLEXERIL) 5 MG tablet Take 1 tablet (5 mg total) by mouth 3 (three) times daily as needed for muscle spasms. (Patient not taking: Reported on 03/27/2016) 30 tablet 1   No facility-administered medications prior to visit.    Review of Systems;  Patient denies headache, fevers, malaise, unintentional weight loss, skin rash, eye pain, sinus congestion and sinus pain, sore throat, dysphagia,  hemoptysis , cough, dyspnea, wheezing, chest pain, palpitations, orthopnea, edema, abdominal pain, nausea, melena, diarrhea, constipation, flank pain, dysuria, hematuria, urinary  Frequency, nocturia, numbness, tingling, seizures,  Focal weakness, Loss of consciousness,  Tremor, insomnia, depression, anxiety, and suicidal ideation.      Objective:  BP 128/78 mmHg  Pulse 94  Temp(Src) 98.4 F (36.9 C) (Oral)  Resp 12  Ht '5\' 8"'  (1.727 m)  Wt 212 lb 12 oz (96.503 kg)  BMI 32.36 kg/m2  SpO2 98%  BP Readings from Last 3 Encounters:  03/27/16 128/78  05/12/15 120/78  09/30/14 112/72    Wt Readings from Last 3 Encounters:  03/27/16 212 lb 12 oz (96.503 kg)  05/12/15 199 lb 4 oz (90.379 kg)  09/30/14 172 lb (78.019 kg)    General appearance: alert, cooperative and appears stated age Ears: normal TM's and external ear canals both ears Throat: lips, mucosa, and tongue normal; teeth and gums normal Neck: no adenopathy, no carotid bruit, supple, symmetrical, trachea midline and thyroid not enlarged, symmetric, no tenderness/mass/nodules Back: symmetric, no curvature. ROM normal. No CVA tenderness. Lungs: clear to auscultation bilaterally Heart: regular rate and  rhythm, S1, S2 normal, no murmur, click, rub or gallop Abdomen: soft, non-tender; bowel sounds normal; no masses,  no organomegaly Pulses: 2+ and symmetric Skin: Skin color, texture, turgor normal. No rashes or lesions Lymph nodes: Cervical, supraclavicular, and axillary nodes normal.  No results found for: HGBA1C  Lab Results  Component Value Date   CREATININE 0.92 03/27/2016   CREATININE 0.68 09/30/2014   CREATININE 0.75 07/29/2012    Lab Results  Component Value Date   WBC 16.4* 07/29/2012   HGB 13.6 07/29/2012   HCT 40.6 07/29/2012   PLT 298 07/29/2012   GLUCOSE 90 03/27/2016   CHOL 156 03/27/2016   TRIG 84 03/27/2016   HDL 36* 03/27/2016   LDLDIRECT 111* 03/27/2016   LDLCALC 103* 03/27/2016   ALT 9 03/27/2016   AST 9 03/27/2016   NA 139 03/27/2016   K 5.0 03/27/2016   CL 104 03/27/2016   CREATININE 0.92 03/27/2016   BUN 19 03/27/2016   CO2 23 03/27/2016   TSH 3.460 03/27/2016    No results found.  Assessment & Plan:   Problem List Items Addressed This Visit    S/P gastric bypass    Checking labs to rule out Vit d, and other acquired deficiencies.       Essential hypertension    Well controlled on current regimen. Renal function stable, no changes today.  Lab Results  Component Value Date   CREATININE 0.92 03/27/2016   Lab Results  Component Value Date   NA 139 03/27/2016   K 5.0 03/27/2016   CL 104 03/27/2016   CO2 23 03/27/2016         Generalized anxiety disorder    Managed with zoloft and prn alprazolam.  The risks and benefits of benzodiazepine use were reviewed with patient today including excessive sedation leading to respiratory depression,  impaired thinking/driving, and addiction.  Patient was advised to avoid concurrent use with alcohol, to use medication only as needed and not to share with others  .       Obesity    I have addressed  BMI and recommended wt loss of 10% of body weigh over the next 6 months using a low glycemic index  diet and regular exercise a minimum of 5 days per week.        Relevant Orders   Comp Met (CMET) (Completed)   Direct LDL (Completed)   Lipid panel (Completed)   TSH (Completed)   B12 (Completed)   VITAMIN D 25 Hydroxy (Vit-D Deficiency, Fractures) (Completed)   Vitamin K1, Serum (Completed)   Vitamin D deficiency    Secondary to gastric bypass.  Will supplement with weekly Drisdol.       Other Visit Diagnoses    Weight gain    -  Primary    Relevant Orders    Comp Met (CMET) (Completed)    Direct LDL (Completed)  Lipid panel (Completed)    TSH (Completed)    B12 (Completed)    VITAMIN D 25 Hydroxy (Vit-D Deficiency, Fractures) (Completed)    Vitamin K1, Serum (Completed)    Gastric bypass status for obesity        Relevant Orders    Comp Met (CMET) (Completed)    Direct LDL (Completed)    Lipid panel (Completed)    TSH (Completed)    B12 (Completed)    VITAMIN D 25 Hydroxy (Vit-D Deficiency, Fractures) (Completed)    Vitamin K1, Serum (Completed)       I have discontinued Ms. Rann's celecoxib and cyclobenzaprine. I am also having her start on ergocalciferol. Additionally, I am having her maintain her spironolactone, omeprazole, HYDROcodone-acetaminophen, lisinopril, ALPRAZolam, and sertraline.  Meds ordered this encounter  Medications  . sertraline (ZOLOFT) 100 MG tablet    Sig: Take 1 tablet by mouth  daily    Dispense:  90 tablet    Refill:  3    Keep on FILE FOR FUTURE REFILLS s  . ergocalciferol (DRISDOL) 50000 units capsule    Sig: Take 1 capsule (50,000 Units total) by mouth once a week.    Dispense:  12 capsule    Refill:  0    Medications Discontinued During This Encounter  Medication Reason  . sertraline (ZOLOFT) 100 MG tablet Reorder  . celecoxib (CELEBREX) 400 MG capsule   . cyclobenzaprine (FLEXERIL) 5 MG tablet     Follow-up: No Follow-up on file.   Crecencio Mc, MD

## 2016-03-27 NOTE — Progress Notes (Signed)
Pre-visit discussion using our clinic review tool. No additional management support is needed unless otherwise documented below in the visit note.  

## 2016-03-28 ENCOUNTER — Encounter: Payer: Self-pay | Admitting: Internal Medicine

## 2016-03-28 LAB — COMPREHENSIVE METABOLIC PANEL
A/G RATIO: 1.4 (ref 1.2–2.2)
ALT: 9 IU/L (ref 0–32)
AST: 9 IU/L (ref 0–40)
Albumin: 3.8 g/dL (ref 3.5–5.5)
Alkaline Phosphatase: 55 IU/L (ref 39–117)
BUN/Creatinine Ratio: 21 (ref 9–23)
BUN: 19 mg/dL (ref 6–20)
CALCIUM: 8.7 mg/dL (ref 8.7–10.2)
CHLORIDE: 104 mmol/L (ref 96–106)
CO2: 23 mmol/L (ref 18–29)
Creatinine, Ser: 0.92 mg/dL (ref 0.57–1.00)
GFR calc Af Amer: 94 mL/min/{1.73_m2} (ref >59)
GFR, EST NON AFRICAN AMERICAN: 81 mL/min/{1.73_m2} (ref >59)
Globulin, Total: 2.8 g/dL (ref 1.5–4.5)
Glucose: 90 mg/dL (ref 65–99)
POTASSIUM: 5 mmol/L (ref 3.5–5.2)
Sodium: 139 mmol/L (ref 134–144)
Total Protein: 6.6 g/dL (ref 6.0–8.5)

## 2016-03-28 LAB — TSH: TSH: 3.46 u[IU]/mL (ref 0.450–4.500)

## 2016-03-28 LAB — LIPID PANEL
CHOL/HDL RATIO: 4.3 ratio (ref 0.0–4.4)
CHOLESTEROL TOTAL: 156 mg/dL (ref 100–199)
HDL: 36 mg/dL — AB (ref >39)
LDL Calculated: 103 mg/dL — ABNORMAL HIGH (ref 0–99)
TRIGLYCERIDES: 84 mg/dL (ref 0–149)
VLDL Cholesterol Cal: 17 mg/dL (ref 5–40)

## 2016-03-28 LAB — LDL CHOLESTEROL, DIRECT: LDL Direct: 111 mg/dL — ABNORMAL HIGH (ref 0–99)

## 2016-03-28 LAB — VITAMIN B12: Vitamin B-12: 417 pg/mL (ref 211–946)

## 2016-03-28 LAB — VITAMIN D 25 HYDROXY (VIT D DEFICIENCY, FRACTURES): VIT D 25 HYDROXY: 14.2 ng/mL — AB (ref 30.0–100.0)

## 2016-03-30 ENCOUNTER — Encounter: Payer: Self-pay | Admitting: Internal Medicine

## 2016-03-30 DIAGNOSIS — E559 Vitamin D deficiency, unspecified: Secondary | ICD-10-CM | POA: Insufficient documentation

## 2016-03-30 LAB — VITAMIN K1, SERUM: VITAMIN K1: 0.45 ng/mL (ref 0.13–1.88)

## 2016-03-30 MED ORDER — ERGOCALCIFEROL 1.25 MG (50000 UT) PO CAPS: 50000.0000 [IU] | ORAL_CAPSULE | ORAL | Status: DC

## 2016-03-30 NOTE — Assessment & Plan Note (Signed)
Managed with zoloft and prn alprazolam.  The risks and benefits of benzodiazepine use were reviewed with patient today including excessive sedation leading to respiratory depression,  impaired thinking/driving, and addiction.  Patient was advised to avoid concurrent use with alcohol, to use medication only as needed and not to share with others  .

## 2016-03-30 NOTE — Assessment & Plan Note (Signed)
Secondary to gastric bypass.  Will supplement with weekly Drisdol.

## 2016-03-30 NOTE — Assessment & Plan Note (Signed)
I have addressed  BMI and recommended wt loss of 10% of body weigh over the next 6 months using a low glycemic index diet and regular exercise a minimum of 5 days per week.   

## 2016-03-30 NOTE — Assessment & Plan Note (Signed)
Checking labs to rule out Vit d, and other acquired deficiencies.

## 2016-03-30 NOTE — Assessment & Plan Note (Signed)
Well controlled on current regimen. Renal function stable, no changes today.  Lab Results  Component Value Date   CREATININE 0.92 03/27/2016   Lab Results  Component Value Date   NA 139 03/27/2016   K 5.0 03/27/2016   CL 104 03/27/2016   CO2 23 03/27/2016

## 2016-04-01 ENCOUNTER — Other Ambulatory Visit: Payer: Self-pay

## 2016-04-01 MED ORDER — ALPRAZOLAM 0.5 MG PO TABS: 0.5000 mg | ORAL_TABLET | Freq: Every evening | ORAL | Status: DC | PRN

## 2016-05-07 ENCOUNTER — Ambulatory Visit: Payer: Self-pay | Admitting: Obstetrics and Gynecology

## 2016-05-24 ENCOUNTER — Telehealth: Payer: Self-pay

## 2016-07-26 ENCOUNTER — Telehealth: Payer: Self-pay

## 2016-07-26 MED ORDER — ALPRAZOLAM 0.5 MG PO TABS: 0.5000 mg | ORAL_TABLET | Freq: Every evening | ORAL | 2 refills | Status: DC | PRN

## 2016-07-26 NOTE — Telephone Encounter (Signed)
Refilled but she will need to come in and sign a contract for chronic refills of controlled substances and will have to submit a UDS and make a follow up appointment in November prior to any additional refills

## 2016-07-26 NOTE — Telephone Encounter (Signed)
Pt is requesting refill on Alprazolam.  Last OV: 03/27/2016 Last Fill: 04/01/2016 #90 and 0RF UDS: Not seen in chart.  Please advise.

## 2016-07-26 NOTE — Telephone Encounter (Signed)
[facility] contract printed with Rx and placed at front desk for pick up. Pt informed via MyChart.

## 2016-07-30 ENCOUNTER — Other Ambulatory Visit: Payer: Self-pay

## 2016-07-30 MED ORDER — ALPRAZOLAM 0.5 MG PO TABS: 0.5000 mg | ORAL_TABLET | Freq: Every evening | ORAL | 0 refills | Status: DC | PRN

## 2016-07-30 NOTE — Telephone Encounter (Signed)
Alprazolam (xanax) 0.5 mg  Rx refill  Last OV 03/27/2016  Last refill 03/27/2016 Please advise

## 2016-07-30 NOTE — Telephone Encounter (Signed)
Refill for 30 days only.  OFFICE VISIT NEEDED every 3 months to continue  refills on controlled substances

## 2016-07-31 ENCOUNTER — Telehealth: Payer: Self-pay

## 2016-07-31 NOTE — Telephone Encounter (Signed)
Patient notified and undetstood

## 2016-07-31 NOTE — Telephone Encounter (Signed)
Spoke with patient about Rx and stated request was sent multiple times she already had a Rx picked up in office and was a mistake that it was sent multiple times

## 2016-08-05 ENCOUNTER — Other Ambulatory Visit: Payer: Self-pay | Admitting: Internal Medicine

## 2016-09-27 ENCOUNTER — Encounter: Payer: Self-pay | Admitting: Internal Medicine

## 2016-09-27 ENCOUNTER — Ambulatory Visit (INDEPENDENT_AMBULATORY_CARE_PROVIDER_SITE_OTHER): Payer: 59 | Admitting: Internal Medicine

## 2016-09-27 DIAGNOSIS — E6609 Other obesity due to excess calories: Secondary | ICD-10-CM | POA: Diagnosis not present

## 2016-09-27 DIAGNOSIS — E559 Vitamin D deficiency, unspecified: Secondary | ICD-10-CM | POA: Diagnosis not present

## 2016-09-27 DIAGNOSIS — Z6833 Body mass index (BMI) 33.0-33.9, adult: Secondary | ICD-10-CM

## 2016-09-27 DIAGNOSIS — F411 Generalized anxiety disorder: Secondary | ICD-10-CM | POA: Diagnosis not present

## 2016-09-27 DIAGNOSIS — I10 Essential (primary) hypertension: Secondary | ICD-10-CM

## 2016-09-27 MED ORDER — ALPRAZOLAM 0.5 MG PO TABS: 0.5000 mg | ORAL_TABLET | Freq: Every evening | ORAL | 5 refills | Status: DC | PRN

## 2016-09-27 MED ORDER — BUSPIRONE HCL 7.5 MG PO TABS: 7.5000 mg | ORAL_TABLET | Freq: Three times a day (TID) | ORAL | 1 refills | Status: DC

## 2016-09-27 MED ORDER — ERGOCALCIFEROL 1.25 MG (50000 UT) PO CAPS: 50000.0000 [IU] | ORAL_CAPSULE | ORAL | 1 refills | Status: DC

## 2016-09-27 NOTE — Progress Notes (Signed)
Pre visit review using our clinic review tool, if applicable. No additional management support is needed unless otherwise documented below in the visit note. 

## 2016-09-27 NOTE — Patient Instructions (Addendum)
I recommend setting up Kaitlyn AlexandersJustin with Dr Everlene OtherJayce Cook for primary care.  He has a special interest in sports medicine  Trial  Of buspirone  of management  Of daytime stressful events  Continue zoloft and alprazolam  As you are doing   Try taking a supplement for "Hair Skin and Nails" to help your  bruising   I am putting you back on the megadose of vitamin d until April

## 2016-09-27 NOTE — Progress Notes (Signed)
Subjective:  Patient ID: Kaitlyn Schmitt, female    DOB: 10/15/1981  Age: 35 y.o. MRN: [id number]  CC: Diagnoses of Generalized anxiety disorder, Class 1 obesity due to excess calories without serious comorbidity with body mass index (BMI) of 33.0 to 33.9 in adult, Essential hypertension, and Vitamin D deficiency were pertinent to this visit.  HPI Kaitlyn Schmitt presents for FOLLOW UP ON GAD   Getting stressed out more easily .  Has Several adolescent children in school,  Still trying to plan her weeding,   Feels that her anxiety is getting triggered more easily.  Not exercising,  Continues to gain weight after reaching a nadir of 172 lbs in Nov 2015 after bariatric surgery several years ago .  hsa been noticing easy bruising without gums bleeding or other sings of clotting disorders.    Outpatient Medications Prior to Visit  Medication Sig Dispense Refill  . lisinopril (PRINIVIL,ZESTRIL) 20 MG tablet Take 1 tablet by mouth  daily 90 tablet 1  . omeprazole (PRILOSEC) 10 MG capsule Take 10 mg by mouth daily. Reported on 03/27/2016    . sertraline (ZOLOFT) 100 MG tablet Take 1 tablet by mouth  daily 90 tablet 3  . ALPRAZolam (XANAX) 0.5 MG tablet Take 1 tablet (0.5 mg total) by mouth at bedtime as needed for sleep. Refill for 30 days only.  OFFICE VISIT NEEDED prior to any more refills 30 tablet 0  . ergocalciferol (DRISDOL) 50000 units capsule Take 1 capsule (50,000 Units total) by mouth once a week. 12 capsule 0  . HYDROcodone-acetaminophen (NORCO/VICODIN) 5-325 MG per tablet Take 1 tablet by mouth 2 (two) times daily as needed. Reported on 03/27/2016    . spironolactone (ALDACTONE) 50 MG tablet Take 50 mg by mouth daily. Reported on 03/27/2016     No facility-administered medications prior to visit.     Review of Systems;  Patient denies headache, fevers, malaise, unintentional weight loss, skin rash, eye pain, sinus congestion and sinus pain, sore throat, dysphagia,  hemoptysis , cough,  dyspnea, wheezing, chest pain, palpitations, orthopnea, edema, abdominal pain, nausea, melena, diarrhea, constipation, flank pain, dysuria, hematuria, urinary  Frequency, nocturia, numbness, tingling, seizures,  Focal weakness, Loss of consciousness,  Tremor, insomnia, depression, anxiety, and suicidal ideation.      Objective:  BP 124/74   Pulse 70   Temp 98.4 F (36.9 C) (Oral)   Ht 5\' 8"  (1.727 m)   Wt 223 lb (101.2 kg)   LMP 09/25/2016   SpO2 99%   BMI 33.91 kg/m   BP Readings from Last 3 Encounters:  09/27/16 124/74  03/27/16 128/78  05/12/15 120/78    Wt Readings from Last 3 Encounters:  09/27/16 223 lb (101.2 kg)  03/27/16 212 lb 12 oz (96.5 kg)  05/12/15 199 lb 4 oz (90.4 kg)    General appearance: alert, cooperative and appears stated age Ears: normal TM's and external ear canals both ears Throat: lips, mucosa, and tongue normal; teeth and gums normal Neck: no adenopathy, no carotid bruit, supple, symmetrical, trachea midline and thyroid not enlarged, symmetric, no tenderness/mass/nodules Back: symmetric, no curvature. ROM normal. No CVA tenderness. Lungs: clear to auscultation bilaterally Heart: regular rate and rhythm, S1, S2 normal, no murmur, click, rub or gallop Abdomen: soft, non-tender; bowel sounds normal; no masses,  no organomegaly Pulses: 2+ and symmetric Skin: Skin color, texture, turgor normal. No rashes or lesions Lymph nodes: Cervical, supraclavicular, and axillary nodes normal.  No results found for: HGBA1C  Lab  Results  Component Value Date   CREATININE 0.92 03/27/2016   CREATININE 0.68 09/30/2014   CREATININE 0.75 07/29/2012    Lab Results  Component Value Date   WBC 16.4 (H) 07/29/2012   HGB 13.6 07/29/2012   HCT 40.6 07/29/2012   PLT 298 07/29/2012   GLUCOSE 90 03/27/2016   CHOL 156 03/27/2016   TRIG 84 03/27/2016   HDL 36 (L) 03/27/2016   LDLDIRECT 111 (H) 03/27/2016   LDLCALC 103 (H) 03/27/2016   ALT 9 03/27/2016   AST 9  03/27/2016   NA 139 03/27/2016   K 5.0 03/27/2016   CL 104 03/27/2016   CREATININE 0.92 03/27/2016   BUN 19 03/27/2016   CO2 23 03/27/2016   TSH 3.460 03/27/2016    No results found.  Assessment & Plan:   Problem List Items Addressed This Visit    Essential hypertension    Well controlled on current regimen. Renal function is due  no changes today.  Lab Results  Component Value Date   CREATININE 0.92 03/27/2016   Lab Results  Component Value Date   NA 139 03/27/2016   K 5.0 03/27/2016   CL 104 03/27/2016   CO2 23 03/27/2016         Generalized anxiety disorder    Advised to avoid escalating use of alprazolam to more than once daily  Refilled only for nighttime use.  zoloft continue at 100 mg daily ,  Adding buspirone for daytime management of anxiety.        Obesity    She is resistant to hearing about the need to lose weight and states "I am comfortable with the weight gain."      Vitamin D deficiency    iatrogenic secondary to gastric bypass surgery.  Drisdol prescribed.          I have discontinued Ms. Reasons's spironolactone and HYDROcodone-acetaminophen. I am also having her start on busPIRone. Additionally, I am having her maintain her omeprazole, sertraline, lisinopril, ALPRAZolam, and ergocalciferol.  Meds ordered this encounter  Medications  . ALPRAZolam (XANAX) 0.5 MG tablet    Sig: Take 1 tablet (0.5 mg total) by mouth at bedtime as needed for sleep. Refill for 30 days only.  OFFICE VISIT NEEDED prior to any more refills    Dispense:  30 tablet    Refill:  5  . busPIRone (BUSPAR) 7.5 MG tablet    Sig: Take 1 tablet (7.5 mg total) by mouth 3 (three) times daily.    Dispense:  90 tablet    Refill:  1  . ergocalciferol (DRISDOL) 50000 units capsule    Sig: Take 1 capsule (50,000 Units total) by mouth once a week.    Dispense:  12 capsule    Refill:  1  A total of 25 minutes of face to face time was spent with patient more than half of which was  spent in counselling about the above mentioned conditions  and coordination of care   Medications Discontinued During This Encounter  Medication Reason  . ALPRAZolam (XANAX) 0.5 MG tablet Reorder  . ergocalciferol (DRISDOL) 50000 units capsule Reorder  . spironolactone (ALDACTONE) 50 MG tablet   . HYDROcodone-acetaminophen (NORCO/VICODIN) 5-325 MG per tablet     Follow-up: Return in about 6 months (around 03/27/2017).   Sherlene ShamsULLO, Tenise Stetler L, MD

## 2016-09-29 NOTE — Assessment & Plan Note (Signed)
iatrogenic secondary to gastric bypass surgery.  Drisdol prescribed.

## 2016-09-29 NOTE — Assessment & Plan Note (Signed)
Well controlled on current regimen. Renal function is due  no changes today.  Lab Results  Component Value Date   CREATININE 0.92 03/27/2016   Lab Results  Component Value Date   NA 139 03/27/2016   K 5.0 03/27/2016   CL 104 03/27/2016   CO2 23 03/27/2016

## 2016-09-29 NOTE — Assessment & Plan Note (Signed)
She is resistant to hearing about the need to lose weight and states "I am comfortable with the weight gain."

## 2016-09-29 NOTE — Assessment & Plan Note (Signed)
Advised to avoid escalating use of alprazolam to more than once daily  Refilled only for nighttime use.  zoloft continue at 100 mg daily ,  Adding buspirone for daytime management of anxiety.

## 2016-10-31 NOTE — Telephone Encounter (Signed)
Encounter error

## 2017-01-19 ENCOUNTER — Other Ambulatory Visit: Payer: Self-pay | Admitting: Internal Medicine

## 2017-01-24 ENCOUNTER — Other Ambulatory Visit: Payer: Self-pay | Admitting: Internal Medicine

## 2017-04-23 ENCOUNTER — Other Ambulatory Visit: Payer: Self-pay | Admitting: Internal Medicine

## 2017-04-27 ENCOUNTER — Other Ambulatory Visit: Payer: Self-pay | Admitting: Internal Medicine

## 2017-04-28 NOTE — Telephone Encounter (Signed)
Mychart message sent re: below.

## 2017-04-28 NOTE — Telephone Encounter (Signed)
Refilled: 09/27/2016 Last OV: 09/27/2016 Next OV: not scheduled

## 2017-04-28 NOTE — Telephone Encounter (Deleted)
Your refill request has been approved for thirty days and will be available in 24 hours ,  But you will need to make an office visit with me prior any more refills since it has been over 6 months and the medication you requested is a controlled substance.   Regards,  Dr. Ryleigh Buenger 

## 2017-06-02 ENCOUNTER — Ambulatory Visit (INDEPENDENT_AMBULATORY_CARE_PROVIDER_SITE_OTHER): Payer: 59 | Admitting: Internal Medicine

## 2017-06-02 ENCOUNTER — Encounter: Payer: Self-pay | Admitting: Internal Medicine

## 2017-06-02 VITALS — BP 110/70 | HR 79 | Temp 98.3°F | Resp 16 | Ht 68.0 in | Wt 231.0 lb

## 2017-06-02 DIAGNOSIS — F411 Generalized anxiety disorder: Secondary | ICD-10-CM | POA: Diagnosis not present

## 2017-06-02 DIAGNOSIS — E7800 Pure hypercholesterolemia, unspecified: Secondary | ICD-10-CM | POA: Diagnosis not present

## 2017-06-02 DIAGNOSIS — E6609 Other obesity due to excess calories: Secondary | ICD-10-CM

## 2017-06-02 DIAGNOSIS — Z6833 Body mass index (BMI) 33.0-33.9, adult: Secondary | ICD-10-CM

## 2017-06-02 DIAGNOSIS — R5383 Other fatigue: Secondary | ICD-10-CM | POA: Diagnosis not present

## 2017-06-02 DIAGNOSIS — I10 Essential (primary) hypertension: Secondary | ICD-10-CM

## 2017-06-02 MED ORDER — TRAZODONE HCL 50 MG PO TABS: 25.0000 mg | ORAL_TABLET | Freq: Every evening | ORAL | 3 refills | Status: DC | PRN

## 2017-06-02 MED ORDER — ALPRAZOLAM 0.5 MG PO TABS: 0.5000 mg | ORAL_TABLET | Freq: Every evening | ORAL | 5 refills | Status: DC | PRN

## 2017-06-02 NOTE — Progress Notes (Signed)
Subjective:  Patient ID: Kaitlyn Schmitt, female    DOB: 09/18/1981  Age: 36 y.o. MRN: [id number]  CC: The primary encounter diagnosis was Fatigue, unspecified type. Diagnoses of Pure hypercholesterolemia, Generalized anxiety disorder, Class 1 obesity due to excess calories without serious comorbidity with body mass index (BMI) of 33.0 to 33.9 in adult, and Essential hypertension were also pertinent to this visit.  HPI Kaitlyn Schmitt presents for follow up on GAD , hypertension and obesity.  GAD:  her Father had a CVA 3 months ago,  She has had increased responsibilities in helping to manage his household during his recovery, and still working full time. .  She has not been sleeping well due to an overactive mind.  She feels tired all the time.   She is Taking zoloft at night,  Using buspirone only for daytime, and alprazolam for insomnia .    Not exercising regularly. Blames schedule has gained 8 lbs since November, and a total of 59 lbs since readching a nadir of 172 lbs in Nov 2015   Hypertension: patient checks blood pressure twice weekly at home.  Readings have been for the most part > 140/80 at rest . Patient is following a reduced salt diet most days and is taking medications as prescribed  Outpatient Medications Prior to Visit  Medication Sig Dispense Refill  . busPIRone (BUSPAR) 7.5 MG tablet TAKE 1 TABLET BY MOUTH 3  TIMES DAILY 180 tablet 0  . ergocalciferol (DRISDOL) 50000 units capsule Take 1 capsule (50,000 Units total) by mouth once a week. 12 capsule 1  . lisinopril (PRINIVIL,ZESTRIL) 20 MG tablet TAKE 1 TABLET BY MOUTH  DAILY 90 tablet 1  . omeprazole (PRILOSEC) 10 MG capsule Take 10 mg by mouth daily. Reported on 03/27/2016    . sertraline (ZOLOFT) 100 MG tablet TAKE 1 TABLET BY MOUTH  DAILY 90 tablet 1  . ALPRAZolam (XANAX) 0.5 MG tablet TAKE 1 TABLET BY MOUTH AT BEDTIME AS NEEDED FOR SLEEP 30 tablet 0   No facility-administered medications prior to visit.     Review of  Systems;  Patient denies headache, fevers, malaise, unintentional weight loss, skin rash, eye pain, sinus congestion and sinus pain, sore throat, dysphagia,  hemoptysis , cough, dyspnea, wheezing, chest pain, palpitations, orthopnea, edema, abdominal pain, nausea, melena, diarrhea, constipation, flank pain, dysuria, hematuria, urinary  Frequency, nocturia, numbness, tingling, seizures,  Focal weakness, Loss of consciousness,  Tremor, insomnia, depression, anxiety, and suicidal ideation.      Objective:  BP 110/70   Pulse 79   Temp 98.3 F (36.8 C) (Oral)   Resp 16   Ht '5\' 8"'$  (1.727 m)   Wt 231 lb (104.8 kg)   LMP 05/31/2017   SpO2 97%   BMI 35.12 kg/m   BP Readings from Last 3 Encounters:  06/02/17 110/70  09/27/16 124/74  03/27/16 128/78    Wt Readings from Last 3 Encounters:  06/02/17 231 lb (104.8 kg)  09/27/16 223 lb (101.2 kg)  03/27/16 212 lb 12 oz (96.5 kg)    General appearance: alert, cooperative and appears stated age Ears: normal TM's and external ear canals both ears Throat: lips, mucosa, and tongue normal; teeth and gums normal Neck: no adenopathy, no carotid bruit, supple, symmetrical, trachea midline and thyroid not enlarged, symmetric, no tenderness/mass/nodules Back: symmetric, no curvature. ROM normal. No CVA tenderness. Lungs: clear to auscultation bilaterally Heart: regular rate and rhythm, S1, S2 normal, no murmur, click, rub or gallop Abdomen: soft, non-tender;  bowel sounds normal; no masses,  no organomegaly Pulses: 2+ and symmetric Skin: Skin color, texture, turgor normal. No rashes or lesions Lymph nodes: Cervical, supraclavicular, and axillary nodes normal.  No results found for: HGBA1C  Lab Results  Component Value Date   CREATININE 0.92 03/27/2016   CREATININE 0.68 09/30/2014   CREATININE 0.75 07/29/2012    Lab Results  Component Value Date   WBC 16.4 (H) 07/29/2012   HGB 13.6 07/29/2012   HCT 40.6 07/29/2012   PLT 298 07/29/2012    GLUCOSE 90 03/27/2016   CHOL 156 03/27/2016   TRIG 84 03/27/2016   HDL 36 (L) 03/27/2016   LDLDIRECT 111 (H) 03/27/2016   LDLCALC 103 (H) 03/27/2016   ALT 9 03/27/2016   AST 9 03/27/2016   NA 139 03/27/2016   K 5.0 03/27/2016   CL 104 03/27/2016   CREATININE 0.92 03/27/2016   BUN 19 03/27/2016   CO2 23 03/27/2016   TSH 3.460 03/27/2016    No results found.  Assessment & Plan:   Problem List Items Addressed This Visit    Essential hypertension    Well controlled on current regimen. Renal function assessment is overdue , no changes today.  Lab Results  Component Value Date   CREATININE 0.92 03/27/2016   Lab Results  Component Value Date   NA 139 03/27/2016   K 5.0 03/27/2016   CL 104 03/27/2016   CO2 23 03/27/2016         Fatigue - Primary    Secondary to sedentary lifestyle and insomnia       Relevant Orders   TSH   T4, free   Comp Met (CMET)   CBC w/Diff   Generalized anxiety disorder    Aggravated by life events. Currently using alprazolam zoloft.  Adding trazodone .  Encouraged to add exercise       Obesity    At last visit she was resistant to hearing about the need to lose weight and stated "I am comfortable with the weight gain." she has gained 60 lbs since the surgery, Body mass index is 35.12 kg/m.. I have addressed  BMI and recommended wt loss of 10% of body weigh over the next 6 months using a low glycemic index diet and regular exercise a minimum of 5 days per week.          Other Visit Diagnoses    Pure hypercholesterolemia       Relevant Orders   Lipid panel     A total of 25 minutes of face to face time was spent with patient more than half of which was spent in counselling about the above mentioned conditions  and coordination of care    I have changed Kaitlyn Schmitt's ALPRAZolam. I am also having her start on traZODone. Additionally, I am having her maintain her omeprazole, ergocalciferol, lisinopril, busPIRone, and sertraline.  Meds  ordered this encounter  Medications  . traZODone (DESYREL) 50 MG tablet    Sig: Take 0.5-1 tablets (25-50 mg total) by mouth at bedtime as needed for sleep.    Dispense:  90 tablet    Refill:  3  . ALPRAZolam (XANAX) 0.5 MG tablet    Sig: Take 1 tablet (0.5 mg total) by mouth at bedtime as needed. for sleep    Dispense:  30 tablet    Refill:  5    Medications Discontinued During This Encounter  Medication Reason  . ALPRAZolam (XANAX) 0.5 MG tablet Reorder  Follow-up: No Follow-up on file.   Crecencio Mc, MD

## 2017-06-02 NOTE — Patient Instructions (Signed)
I am adding trazodone at night to help you sleep  Take 1/2 to 1 tablet after dinner,  Ok to combine with zoloft   If still wide awake  When head hits pillow,  you may take an alprazolam  START EXERCISING.  IT IS AN IMPORTANT PART OF ANY  MANAGEMENT PLAN OF WEIGHT AND ANXIETY

## 2017-06-04 NOTE — Assessment & Plan Note (Signed)
At last visit she was resistant to hearing about the need to lose weight and stated "I am comfortable with the weight gain." she has gained 60 lbs since the surgery, Body mass index is 35.12 kg/m.. I have addressed  BMI and recommended wt loss of 10% of body weigh over the next 6 months using a low glycemic index diet and regular exercise a minimum of 5 days per week.

## 2017-06-04 NOTE — Assessment & Plan Note (Signed)
Aggravated by life events. Currently using alprazolam zoloft.  Adding trazodone .  Encouraged to add exercise

## 2017-06-04 NOTE — Assessment & Plan Note (Signed)
Secondary to sedentary lifestyle and insomnia

## 2017-06-04 NOTE — Assessment & Plan Note (Addendum)
Well controlled on current regimen. Renal function assessment is overdue , no changes today.  Lab Results  Component Value Date   CREATININE 0.92 03/27/2016   Lab Results  Component Value Date   NA 139 03/27/2016   K 5.0 03/27/2016   CL 104 03/27/2016   CO2 23 03/27/2016

## 2017-10-03 ENCOUNTER — Other Ambulatory Visit: Payer: Self-pay | Admitting: Internal Medicine

## 2018-03-10 ENCOUNTER — Other Ambulatory Visit: Payer: Self-pay | Admitting: Internal Medicine

## 2018-07-29 ENCOUNTER — Other Ambulatory Visit: Payer: Self-pay | Admitting: Internal Medicine

## 2018-07-30 NOTE — Telephone Encounter (Signed)
Sertraline    Refilled: 03/11/2018 Lisinopril   Refilled: 03/11/2018  Last OV: 06/02/2017 Next OV: not scheduled

## 2019-01-15 ENCOUNTER — Other Ambulatory Visit: Payer: Self-pay | Admitting: Internal Medicine

## 2019-07-04 ENCOUNTER — Other Ambulatory Visit: Payer: Self-pay | Admitting: Internal Medicine

## 2019-07-05 NOTE — Telephone Encounter (Signed)
Refill request for lisinopril.

## 2019-07-20 ENCOUNTER — Other Ambulatory Visit: Payer: Self-pay | Admitting: Internal Medicine

## 2019-09-13 DIAGNOSIS — M5416 Radiculopathy, lumbar region: Secondary | ICD-10-CM | POA: Diagnosis not present

## 2019-09-13 DIAGNOSIS — M7062 Trochanteric bursitis, left hip: Secondary | ICD-10-CM | POA: Diagnosis not present

## 2019-09-13 DIAGNOSIS — M5136 Other intervertebral disc degeneration, lumbar region: Secondary | ICD-10-CM | POA: Diagnosis not present

## 2019-11-25 ENCOUNTER — Other Ambulatory Visit: Payer: Self-pay | Admitting: Internal Medicine

## 2019-12-11 ENCOUNTER — Other Ambulatory Visit: Payer: Self-pay | Admitting: Internal Medicine

## 2020-01-21 DIAGNOSIS — M5416 Radiculopathy, lumbar region: Secondary | ICD-10-CM | POA: Diagnosis not present

## 2020-01-21 DIAGNOSIS — M5136 Other intervertebral disc degeneration, lumbar region: Secondary | ICD-10-CM | POA: Diagnosis not present

## 2020-01-21 DIAGNOSIS — M7062 Trochanteric bursitis, left hip: Secondary | ICD-10-CM | POA: Diagnosis not present

## 2020-02-04 ENCOUNTER — Ambulatory Visit: Payer: 59

## 2020-02-09 DIAGNOSIS — M5416 Radiculopathy, lumbar region: Secondary | ICD-10-CM | POA: Diagnosis not present

## 2020-02-09 DIAGNOSIS — M5126 Other intervertebral disc displacement, lumbar region: Secondary | ICD-10-CM | POA: Diagnosis not present

## 2020-02-12 ENCOUNTER — Ambulatory Visit: Payer: 59

## 2020-05-08 DIAGNOSIS — M5416 Radiculopathy, lumbar region: Secondary | ICD-10-CM | POA: Diagnosis not present

## 2020-05-08 DIAGNOSIS — M5126 Other intervertebral disc displacement, lumbar region: Secondary | ICD-10-CM | POA: Diagnosis not present

## 2020-05-08 DIAGNOSIS — M7062 Trochanteric bursitis, left hip: Secondary | ICD-10-CM | POA: Diagnosis not present

## 2020-05-08 DIAGNOSIS — M5136 Other intervertebral disc degeneration, lumbar region: Secondary | ICD-10-CM | POA: Diagnosis not present

## 2020-08-14 DIAGNOSIS — M5136 Other intervertebral disc degeneration, lumbar region: Secondary | ICD-10-CM | POA: Diagnosis not present

## 2020-08-14 DIAGNOSIS — M5126 Other intervertebral disc displacement, lumbar region: Secondary | ICD-10-CM | POA: Diagnosis not present

## 2020-08-14 DIAGNOSIS — M5416 Radiculopathy, lumbar region: Secondary | ICD-10-CM | POA: Diagnosis not present

## 2020-10-11 ENCOUNTER — Other Ambulatory Visit: Payer: Self-pay | Admitting: Internal Medicine

## 2021-04-02 ENCOUNTER — Other Ambulatory Visit: Payer: Self-pay

## 2021-04-02 ENCOUNTER — Encounter: Payer: Self-pay | Admitting: Adult Health

## 2021-04-02 ENCOUNTER — Ambulatory Visit: Payer: BC Managed Care – PPO | Admitting: Adult Health

## 2021-04-02 VITALS — BP 150/80 | HR 100 | Temp 97.9°F | Resp 16 | Wt 196.8 lb

## 2021-04-02 DIAGNOSIS — Z9884 Bariatric surgery status: Secondary | ICD-10-CM

## 2021-04-02 DIAGNOSIS — R4589 Other symptoms and signs involving emotional state: Secondary | ICD-10-CM | POA: Diagnosis not present

## 2021-04-02 DIAGNOSIS — F419 Anxiety disorder, unspecified: Secondary | ICD-10-CM | POA: Diagnosis not present

## 2021-04-02 DIAGNOSIS — D649 Anemia, unspecified: Secondary | ICD-10-CM | POA: Diagnosis not present

## 2021-04-02 MED ORDER — SERTRALINE HCL 100 MG PO TABS: 100.0000 mg | ORAL_TABLET | Freq: Every day | ORAL | 0 refills | Status: DC

## 2021-04-02 MED ORDER — HYDROXYZINE HCL 10 MG PO TABS: 10.0000 mg | ORAL_TABLET | Freq: Three times a day (TID) | ORAL | 0 refills | Status: DC | PRN

## 2021-04-02 NOTE — Progress Notes (Signed)
Acute Office Visit  Subjective:    Patient ID: Kaitlyn Schmitt, female    DOB: 24-Nov-1980, 40 y.o.   MRN: [id number]  Chief Complaint  Patient presents with  . Anxiety    HPI Patient is in today for anxiety and she reports she missed her Zoloft for one week. She does not feel hopeless she just has been very tearful.  No new events other than son came home and had his first break up and husband has quit his job twice and she has her mother with her who helps her and supports her.   So stressed not eating. Has had gastric bypass years ago.  She has been on Zoloft 100mg  for years once daily she reports she has always been fine on this dosage.  She also reports missing the past 2 days of her medication Zoloft.   She has xanax on medication list she is not on it now.   She has trazodone onlist but not taking.   She missed her medication two weeks ago and missed one week of her Zoloft.  Denies any new medications or substance abuse/ or any alcohol use.  Denies any suicidal or homicidal ideations.   Patient's last menstrual period was 03/10/2021.- was normal 3 weeks ago. Denies any pregnancy chance not been sexually active.  Mother with her today and very supportive.   Patient  denies any fever, body aches,chills, rash, chest pain, shortness of breath, nausea, vomiting, or diarrhea.  Denies dizziness, lightheadedness, pre syncopal or syncopal episodes.  She is on muscle relaxer at bedtime  For chronic back pain she reports and denies any worsening symptoms. PMP aware verified.  Fill Date Drug Qty Days Prescriber Pharm Refill MgEq MgEq/Day  03/10/2021 Hydrocodone-Acetamin 5-325 Mg 60.00 30 [facility] 0 300.00 10.00      Past Medical History:  Diagnosis Date  . GERD (gastroesophageal reflux disease)   . Hx of migraines   . Hyperlipidemia   . Hypertension     Past Surgical History:  Procedure Laterality Date  . CHOLECYSTECTOMY  2012  . GASTRIC BYPASS  2013  .  TONSILLECTOMY AND ADENOIDECTOMY  2014    Family History  Problem Relation Age of Onset  . Arthritis Father   . Hyperlipidemia Father   . Hypertension Father   . Arthritis Paternal Grandmother   . Heart disease Paternal Grandmother   . Hypertension Paternal Grandmother   . Diabetes Paternal Grandmother     Social History   Socioeconomic History  . Marital status: Single    Spouse name: Not on file  . Number of children: 1  . Years of education: 73  . Highest education level: Not on file  Occupational History  . Occupation: 14: [employer]  Tobacco Use  . Smoking status: Former Smoker    Quit date: 05/18/2014    Years since quitting: 6.8  . Smokeless tobacco: Never Used  Vaping Use  . Vaping Use: Never used  Substance and Sexual Activity  . Alcohol use: Yes    Alcohol/week: 0.0 standard drinks    Comment: socially  . Drug use: No  . Sexual activity: Yes  Other Topics Concern  . Not on file  Social History Narrative   Kaitlyn Schmitt grew up in [place]. She is single and has a son Kaitlyn Schmitt). They have a dog (Zoe). She works in [employer] at [employer]. She spends most of her spare time with her son. She enjoys  outdoor activities.      Caffeine - 20 oz daily   Exercise - Daily walking.          Social Determinants of Health   Financial Resource Strain: Not on file  Food Insecurity: Not on file  Transportation Needs: Not on file  Physical Activity: Not on file  Stress: Not on file  Social Connections: Not on file  Intimate Partner Violence: Not on file    Outpatient Medications Prior to Visit  Medication Sig Dispense Refill  . busPIRone (BUSPAR) 7.5 MG tablet TAKE 1 TABLET BY MOUTH 3  TIMES DAILY 180 tablet 0  . HYDROcodone-acetaminophen (NORCO/VICODIN) 5-325 MG tablet Take 1 tablet by mouth 2 (two) times daily as needed.    Marland Kitchen omeprazole (PRILOSEC) 10 MG capsule Take 10 mg by mouth daily. Reported on 03/27/2016    . tiZANidine (ZANAFLEX) 2 MG  tablet Take 2-4 mg by mouth at bedtime as needed.    . ALPRAZolam (XANAX) 0.5 MG tablet Take 1 tablet (0.5 mg total) by mouth at bedtime as needed. for sleep 30 tablet 5  . ergocalciferol (DRISDOL) 50000 units capsule Take 1 capsule (50,000 Units total) by mouth once a week. 12 capsule 1  . lisinopril (PRINIVIL,ZESTRIL) 20 MG tablet TAKE 1 TABLET BY MOUTH  DAILY 90 tablet 1  . sertraline (ZOLOFT) 100 MG tablet TAKE 1 TABLET BY MOUTH  DAILY 90 tablet 3  . traZODone (DESYREL) 50 MG tablet Take 0.5-1 tablets (25-50 mg total) by mouth at bedtime as needed for sleep. 90 tablet 3   No facility-administered medications prior to visit.    No Known Allergies  Review of Systems  Constitutional: Positive for fatigue. Negative for activity change, appetite change, chills, diaphoresis, fever and unexpected weight change.  HENT: Negative.   Eyes: Negative.   Respiratory: Negative.   Cardiovascular: Negative.   Gastrointestinal: Positive for nausea. Negative for abdominal distention, abdominal pain, anal bleeding, blood in stool, constipation, diarrhea, rectal pain and vomiting.  Endocrine: Negative.   Genitourinary: Negative.  Negative for menstrual problem.  Musculoskeletal: Negative.   Skin: Negative.   Neurological: Negative for dizziness, tremors, seizures, syncope, facial asymmetry, speech difficulty, weakness, light-headedness, numbness and headaches.  Psychiatric/Behavioral: Positive for decreased concentration and sleep disturbance. Negative for behavioral problems, confusion, dysphoric mood, hallucinations, self-injury and suicidal ideas. The patient is nervous/anxious. The patient is not hyperactive.        Objective:    Physical Exam Vitals reviewed.  Constitutional:      General: She is not in acute distress.    Appearance: She is well-developed. She is not diaphoretic.     Interventions: She is not intubated. HENT:     Head: Normocephalic and atraumatic.     Right Ear: External  ear normal.     Left Ear: External ear normal.     Nose: Nose normal.     Mouth/Throat:     Pharynx: No oropharyngeal exudate.  Eyes:     General: Lids are normal. No scleral icterus.       Right eye: No discharge.        Left eye: No discharge.     Conjunctiva/sclera: Conjunctivae normal.     Right eye: Right conjunctiva is not injected. No exudate or hemorrhage.    Left eye: Left conjunctiva is not injected. No exudate or hemorrhage.    Pupils: Pupils are equal, round, and reactive to light.  Neck:     Thyroid: No thyroid mass or thyromegaly.  Vascular: Normal carotid pulses. No carotid bruit, hepatojugular reflux or JVD.     Trachea: Trachea and phonation normal. No tracheal tenderness or tracheal deviation.     Meningeal: Brudzinski's sign and Kernig's sign absent.  Cardiovascular:     Rate and Rhythm: Normal rate and regular rhythm.     Pulses: Normal pulses.          Radial pulses are 2+ on the right side and 2+ on the left side.       Dorsalis pedis pulses are 2+ on the right side and 2+ on the left side.       Posterior tibial pulses are 2+ on the right side and 2+ on the left side.     Heart sounds: Normal heart sounds, S1 normal and S2 normal. Heart sounds not distant. No murmur heard. No friction rub. No gallop.   Pulmonary:     Effort: Pulmonary effort is normal. No tachypnea, bradypnea, accessory muscle usage or respiratory distress. She is not intubated.     Breath sounds: Normal breath sounds. No stridor. No wheezing or rales.  Chest:     Chest wall: No tenderness.  Breasts:     Right: No supraclavicular adenopathy.     Left: No supraclavicular adenopathy.    Abdominal:     General: Bowel sounds are normal. There is no distension or abdominal bruit.     Palpations: Abdomen is soft. There is no shifting dullness, fluid wave, hepatomegaly, splenomegaly, mass or pulsatile mass.     Tenderness: There is no abdominal tenderness. There is no left CVA tenderness,  guarding or rebound.     Hernia: No hernia is present.  Musculoskeletal:        General: No tenderness or deformity. Normal range of motion.     Cervical back: Full passive range of motion without pain, normal range of motion and neck supple. No edema, erythema or rigidity. No spinous process tenderness or muscular tenderness. Normal range of motion.  Lymphadenopathy:     Head:     Right side of head: No submental, submandibular, tonsillar, preauricular, posterior auricular or occipital adenopathy.     Left side of head: No submental, submandibular, tonsillar, preauricular, posterior auricular or occipital adenopathy.     Cervical: No cervical adenopathy.     Right cervical: No superficial, deep or posterior cervical adenopathy.    Left cervical: No superficial, deep or posterior cervical adenopathy.     Upper Body:     Right upper body: No supraclavicular or pectoral adenopathy.     Left upper body: No supraclavicular or pectoral adenopathy.  Skin:    General: Skin is warm and dry.     Coloration: Skin is not jaundiced or pale.     Findings: No abrasion, bruising, burn, ecchymosis, erythema, lesion, petechiae or rash.     Nails: There is no clubbing.  Neurological:     Mental Status: She is alert and oriented to person, place, and time.     GCS: GCS eye subscore is 4. GCS verbal subscore is 5. GCS motor subscore is 6.     Cranial Nerves: No cranial nerve deficit.     Sensory: No sensory deficit.     Motor: No weakness, tremor, atrophy, abnormal muscle tone or seizure activity.     Coordination: Coordination normal.     Gait: Gait normal.     Deep Tendon Reflexes: Reflexes are normal and symmetric. Reflexes normal. Babinski sign absent on the right side. Babinski  sign absent on the left side.     Reflex Scores:      Tricep reflexes are 2+ on the right side and 2+ on the left side.      Bicep reflexes are 2+ on the right side and 2+ on the left side.      Brachioradialis reflexes are  2+ on the right side and 2+ on the left side.      Patellar reflexes are 2+ on the right side and 2+ on the left side.      Achilles reflexes are 2+ on the right side and 2+ on the left side. Psychiatric:        Attention and Perception: Attention and perception normal. She is attentive. She does not perceive auditory or visual hallucinations.        Mood and Affect: Mood is anxious. Affect is tearful.        Speech: Speech normal.        Behavior: Behavior normal. Behavior is cooperative.        Thought Content: Thought content normal. Thought content is not paranoid or delusional. Thought content does not include homicidal or suicidal ideation. Thought content does not include homicidal or suicidal plan.        Cognition and Memory: Cognition and memory normal.        Judgment: Judgment normal.     BP (!) 150/80   Pulse 100   Temp 97.9 F (36.6 C)   Resp 16   Wt 196 lb 12.8 oz (89.3 kg)   LMP 03/10/2021   SpO2 99%   BMI 29.92 kg/m  Wt Readings from Last 3 Encounters:  04/02/21 196 lb 12.8 oz (89.3 kg)  06/02/17 231 lb (104.8 kg)  09/27/16 223 lb (101.2 kg)    Health Maintenance Due  Topic Date Due  . COVID-19 Vaccine (1) Never done  . HIV Screening  Never done  . Hepatitis C Screening  Never done  . PAP SMEAR-Modifier  09/30/2016    There are no preventive care reminders to display for this patient.   Lab Results  Component Value Date   TSH 3.460 03/27/2016   Lab Results  Component Value Date   WBC 16.4 (H) 07/29/2012   HGB 13.6 07/29/2012   HCT 40.6 07/29/2012   MCV 92 07/29/2012   PLT 298 07/29/2012   Lab Results  Component Value Date   NA 139 03/27/2016   K 5.0 03/27/2016   CO2 23 03/27/2016   GLUCOSE 90 03/27/2016   BUN 19 03/27/2016   CREATININE 0.92 03/27/2016   BILITOT <0.2 03/27/2016   ALKPHOS 55 03/27/2016   AST 9 03/27/2016   ALT 9 03/27/2016   PROT 6.6 03/27/2016   ALBUMIN 3.8 03/27/2016   CALCIUM 8.7 03/27/2016   ANIONGAP 7 07/29/2012    Lab Results  Component Value Date   CHOL 156 03/27/2016   Lab Results  Component Value Date   HDL 36 (L) 03/27/2016   Lab Results  Component Value Date   LDLCALC 103 (H) 03/27/2016   Lab Results  Component Value Date   TRIG 84 03/27/2016   Lab Results  Component Value Date   CHOLHDL 4.3 03/27/2016   No results found for: HGBA1C     Assessment & Plan:   Problem List Items Addressed This Visit      Other   History of gastric bypass   Relevant Orders   B12   VITAMIN D 25 Hydroxy (Vit-D Deficiency, Fractures)  Tearfulness   Anxiety - Primary   Relevant Medications   sertraline (ZOLOFT) 100 MG tablet   hydrOXYzine (ATARAX/VISTARIL) 10 MG tablet   Other Relevant Orders   CBC with Differential/Platelet   TSH   Comprehensive metabolic panel     Since she missed Zoloft 100mg  po qd for 7 days two weeks ago and again for the past two days and did not notice these symptoms and realized she was having these symptoms after missing medication I suspect she is having some mild withdrawal symptoms. She will start back on her ordered dose as above and will also give Atarax 10mg  po BID and she is aware she can increase that to 2 tablets ( 20 mg ) TID as needed ifthe lower dosage not working and can decrease this dose and stop Atarax after 1 - 2  Weeks if she is feeling better. Aware of side effects possible. She would like to avoid any benzodiazapine.  Do recommend setting a reminder on phone to take medications.   She did have Buspar and Trazodone on list of medications but reported she has not been on these medications in years.   Advised recommend referral to psychiatry she would like  To hold off until we receive her lab work back to see if any reversible cause. Counseling alkways advised.  Last labs 3 years ago.   Discussed known black box warning for anti depression/ anxiety medication. Need to report any behavioral changes right, if any homicidal or suicidal thoughts or  ideas seek medical attention right away. Call 911.   PMP aware checked.  Fill Date Drug Qty Days Prescriber Pharm Refill MgEq MgEq/Day  03/10/2021 Hydrocodone-Acetamin 5-325 Mg 60.00 30 [facility] 0 300.00 10.00    Orders Placed This Encounter  Procedures  . B12  . CBC with Differential/Platelet  . TSH  . VITAMIN D 25 Hydroxy (Vit-D Deficiency, Fractures)  . Comprehensive metabolic panel   Labs today.    Meds ordered this encounter  Medications  . sertraline (ZOLOFT) 100 MG tablet    Sig: Take 1 tablet (100 mg total) by mouth daily.    Dispense:  90 tablet    Refill:  0  . hydrOXYzine (ATARAX/VISTARIL) 10 MG tablet    Sig: Take 1 tablet (10 mg total) by mouth 3 (three) times daily as needed (will cause drowsiness).    Dispense:  90 tablet    Refill:  0   Return in about 3 weeks (around 04/23/2021), or if symptoms worsen or fail to improve, for at any time for any worsening symptoms, Go to Emergency room/ urgent care if worse.  Return precautions given. Red Flags discussed. The patient was given clear instructions to go to ER or return to medical center if any red flags develop, symptoms do not improve, worsen or new problems develop. They verbalized understanding.    Risks, benefits, and alternatives of the medications and treatment plan prescribed today were discussed, and patient expressed understanding.    Education regarding symptom management and diagnosis given to patient on AVS.  Patient was in agreement with treatment plan.   Continue to follow with  Eula FriedMichelle S. Jillane Po AGNP-C, FNP-C for routine health maintenance.   Eula FriedMichelle S. [place] Lapaglia AGNP-C, FNP-C  Addressed acute and or chronic medical problems today requiring 30 minutes reviewing patients medical record,labs, counseling patient regarding patient's conditions, any medications, answering questions regarding health, and coordination of care as needed. After visit summary patient given copy and reviewed.   Marcelino DusterMichelle  Rolinda Roan, FNP

## 2021-04-02 NOTE — Patient Instructions (Signed)
[email or web address]">  Generalized Anxiety Disorder, Adult Generalized anxiety disorder (GAD) is a mental health condition. Unlike normal worries, anxiety related to GAD is not triggered by a specific event. These worries do not fade or get better with time. GAD interferes with relationships, work, and school. GAD symptoms can vary from mild to severe. People with severe GAD can have intense waves of anxiety with physical symptoms that are similar to panic attacks. What are the causes? The exact cause of GAD is not known, but the following are believed to have an impact:  Differences in natural brain chemicals.  Genes passed down from parents to children.  Differences in the way threats are perceived.  Development during childhood.  Personality. What increases the risk? The following factors may make you more likely to develop this condition:  Being female.  Having a family history of anxiety disorders.  Being very shy.  Experiencing very stressful life events, such as the death of a loved one.  Having a very stressful family environment. What are the signs or symptoms? People with GAD often worry excessively about many things in their lives, such as their health and family. Symptoms may also include:  Mental and emotional symptoms: ? Worrying excessively about natural disasters. ? Fear of being late. ? Difficulty concentrating. ? Fears that others are judging your performance.  Physical symptoms: ? Fatigue. ? Headaches, muscle tension, muscle twitches, trembling, or feeling shaky. ? Feeling like your heart is pounding or beating very fast. ? Feeling out of breath or like you cannot take a deep breath. ? Having trouble falling asleep or staying asleep, or experiencing restlessness. ? Sweating. ? Nausea, diarrhea, or irritable bowel syndrome (IBS).  Behavioral symptoms: ? Experiencing erratic moods or irritability. ? Avoidance of new situations. ? Avoidance of  people. ? Extreme difficulty making decisions. How is this diagnosed? This condition is diagnosed based on your symptoms and medical history. You will also have a physical exam. Your health care provider may perform tests to rule out other possible causes of your symptoms. To be diagnosed with GAD, a person must have anxiety that:  Is out of his or her control.  Affects several different aspects of his or her life, such as work and relationships.  Causes distress that makes him or her unable to take part in normal activities.  Includes at least three symptoms of GAD, such as restlessness, fatigue, trouble concentrating, irritability, muscle tension, or sleep problems. Before your health care provider can confirm a diagnosis of GAD, these symptoms must be present more days than they are not, and they must last for 6 months or longer. How is this treated? This condition may be treated with:  Medicine. Antidepressant medicine is usually prescribed for long-term daily control. Anti-anxiety medicines may be added in severe cases, especially when panic attacks occur.  Talk therapy (psychotherapy). Certain types of talk therapy can be helpful in treating GAD by providing support, education, and guidance. Options include: ? Cognitive behavioral therapy (CBT). People learn coping skills and self-calming techniques to ease their physical symptoms. They learn to identify unrealistic thoughts and behaviors and to replace them with more appropriate thoughts and behaviors. ? Acceptance and commitment therapy (ACT). This treatment teaches people how to be mindful as a way to cope with unwanted thoughts and feelings. ? Biofeedback. This process trains you to manage your body's response (physiological response) through breathing techniques and relaxation methods. You will work with a therapist while machines are used to monitor your physical   symptoms.  Stress management techniques. These include yoga,  meditation, and exercise. A mental health specialist can help determine which treatment is best for you. Some people see improvement with one type of therapy. However, other people require a combination of therapies.   Follow these instructions at home: Lifestyle  Maintain a consistent routine and schedule.  Anticipate stressful situations. Create a plan, and allow extra time to work with your plan.  Practice stress management or self-calming techniques that you have learned from your therapist or your health care provider. General instructions  Take over-the-counter and prescription medicines only as told by your health care provider.  Understand that you are likely to have setbacks. Accept this and be kind to yourself as you persist to take better care of yourself.  Recognize and accept your accomplishments, even if you judge them as small.  Keep all follow-up visits as told by your health care provider. This is important. Contact a health care provider if:  Your symptoms do not get better.  Your symptoms get worse.  You have signs of depression, such as: ? A persistently sad or irritable mood. ? Loss of enjoyment in activities that used to bring you joy. ? Change in weight or eating. ? Changes in sleeping habits. ? Avoiding friends or family members. ? Loss of energy for normal tasks. ? Feelings of guilt or worthlessness. Get help right away if:  You have serious thoughts about hurting yourself or others. If you ever feel like you may hurt yourself or others, or have thoughts about taking your own life, get help right away. Go to your nearest emergency department or:  Call your local emergency services (911 in the [place]  Call a suicide crisis helpline, such as the National Suicide Prevention Lifeline at 770-693-5053. This is open 24 hours a day in the [place]  Text the Crisis Text Line at 505-834-7334 (in the [place] Summary  Generalized anxiety disorder (GAD) is a mental  health condition that involves worry that is not triggered by a specific event.  People with GAD often worry excessively about many things in their lives, such as their health and family.  GAD may cause symptoms such as restlessness, trouble concentrating, sleep problems, frequent sweating, nausea, diarrhea, headaches, and trembling or muscle twitching.  A mental health specialist can help determine which treatment is best for you. Some people see improvement with one type of therapy. However, other people require a combination of therapies. This information is not intended to replace advice given to you by your health care provider. Make sure you discuss any questions you have with your health care provider. Document Revised: 08/25/2019 Document Reviewed: 08/25/2019 Elsevier Patient Education  2021 Elsevier Inc.    Sertraline Tablets What is this medicine? SERTRALINE (SER tra leen) is used to treat depression. It may also be used to treat obsessive compulsive disorder, panic disorder, post-trauma stress disorder, premenstrual dysphoric disorder (PMDD) or social anxiety. This medicine may be used for other purposes; ask your health care provider or pharmacist if you have questions. COMMON BRAND NAME(S): Zoloft What should I tell my health care provider before I take this medicine? They need to know if you have any of these conditions:  bleeding disorders  bipolar disorder or a family history of bipolar disorder  glaucoma  heart disease  high blood pressure  history of irregular heartbeat  history of low levels of calcium, magnesium, or potassium in the blood  if you often drink alcohol  liver disease  receiving electroconvulsive therapy  seizures  suicidal thoughts, plans, or attempt; a previous suicide attempt by you or a family member  take medicines that treat or prevent blood clots  thyroid disease  an unusual or allergic reaction to sertraline, other medicines,  foods, dyes, or preservatives  pregnant or trying to get pregnant  breast-feeding How should I use this medicine? Take this medicine by mouth with a glass of water. Follow the directions on the prescription label. You can take it with or without food. Take your medicine at regular intervals. Do not take your medicine more often than directed. Do not stop taking this medicine suddenly except upon the advice of your doctor. Stopping this medicine too quickly may cause serious side effects or your condition may worsen. A special MedGuide will be given to you by the pharmacist with each prescription and refill. Be sure to read this information carefully each time. Talk to your pediatrician regarding the use of this medicine in children. While this drug may be prescribed for children as young as 7 years for selected conditions, precautions do apply. Overdosage: If you think you have taken too much of this medicine contact a poison control center or emergency room at once. NOTE: This medicine is only for you. Do not share this medicine with others. What if I miss a dose? If you miss a dose, take it as soon as you can. If it is almost time for your next dose, take only that dose. Do not take double or extra doses. What may interact with this medicine? Do not take this medicine with any of the following medications:  cisapride  dronedarone  linezolid  MAOIs like Carbex, Eldepryl, Marplan, Nardil, and Parnate  methylene blue (injected into a vein)  pimozide  thioridazine This medicine may also interact with the following medications:  alcohol  amphetamines  aspirin and aspirin-like medicines  certain medicines for depression, anxiety, or psychotic disturbances  certain medicines for fungal infections like ketoconazole, fluconazole, posaconazole, and itraconazole  certain medicines for irregular heart beat like flecainide, quinidine, propafenone  certain medicines for migraine  headaches like almotriptan, eletriptan, frovatriptan, naratriptan, rizatriptan, sumatriptan, zolmitriptan  certain medicines for sleep  certain medicines for seizures like carbamazepine, valproic acid, phenytoin  certain medicines that treat or prevent blood clots like warfarin, enoxaparin, dalteparin  cimetidine  digoxin  diuretics  fentanyl  isoniazid  lithium  NSAIDs, medicines for pain and inflammation, like ibuprofen or naproxen  other medicines that prolong the QT interval (cause an abnormal heart rhythm) like dofetilide  rasagiline  safinamide  supplements like St. John's wort, kava kava, valerian  tolbutamide  tramadol  tryptophan This list may not describe all possible interactions. Give your health care provider a list of all the medicines, herbs, non-prescription drugs, or dietary supplements you use. Also tell them if you smoke, drink alcohol, or use illegal drugs. Some items may interact with your medicine. What should I watch for while using this medicine? Tell your doctor if your symptoms do not get better or if they get worse. Visit your doctor or health care professional for regular checks on your progress. Because it may take several weeks to see the full effects of this medicine, it is important to continue your treatment as prescribed by your doctor. Patients and their families should watch out for new or worsening thoughts of suicide or depression. Also watch out for sudden changes in feelings such as feeling anxious, agitated, panicky, irritable, hostile, aggressive, impulsive, severely restless,  overly excited and hyperactive, or not being able to sleep. If this happens, especially at the beginning of treatment or after a change in dose, call your health care professional. Bonita Quin may get drowsy or dizzy. Do not drive, use machinery, or do anything that needs mental alertness until you know how this medicine affects you. Do not stand or sit up quickly,  especially if you are an older patient. This reduces the risk of dizzy or fainting spells. Alcohol may interfere with the effect of this medicine. Avoid alcoholic drinks. Your mouth may get dry. Chewing sugarless gum or sucking hard candy, and drinking plenty of water may help. Contact your doctor if the problem does not go away or is severe. What side effects may I notice from receiving this medicine? Side effects that you should report to your doctor or health care professional as soon as possible:  allergic reactions like skin rash, itching or hives, swelling of the face, lips, or tongue  anxious  black, tarry stools  changes in vision  confusion  elevated mood, decreased need for sleep, racing thoughts, impulsive behavior  eye pain  fast, irregular heartbeat  feeling faint or lightheaded, falls  feeling agitated, angry, or irritable  hallucination, loss of contact with reality  loss of balance or coordination  loss of memory  painful or prolonged erections  restlessness, pacing, inability to keep still  seizures  stiff muscles  suicidal thoughts or other mood changes  trouble sleeping  unusual bleeding or bruising  unusually weak or tired  vomiting Side effects that usually do not require medical attention (report to your doctor or health care professional if they continue or are bothersome):  change in appetite or weight  change in sex drive or performance  diarrhea  increased sweating  indigestion, nausea  tremors This list may not describe all possible side effects. Call your doctor for medical advice about side effects. You may report side effects to FDA at 1-800-FDA-1088. Where should I keep my medicine? Keep out of the reach of children. Store at room temperature between 15 and 30 degrees C (59 and 86 degrees F). Throw away any unused medicine after the expiration date. NOTE: This sheet is a summary. It may not cover all possible information.  If you have questions about this medicine, talk to your doctor, pharmacist, or health care provider.  2021 Elsevier/Gold Standard (2020-09-14 09:18:23)

## 2021-04-03 ENCOUNTER — Other Ambulatory Visit: Payer: Self-pay | Admitting: Adult Health

## 2021-04-03 DIAGNOSIS — E559 Vitamin D deficiency, unspecified: Secondary | ICD-10-CM

## 2021-04-03 DIAGNOSIS — R110 Nausea: Secondary | ICD-10-CM

## 2021-04-03 DIAGNOSIS — D649 Anemia, unspecified: Secondary | ICD-10-CM

## 2021-04-03 LAB — COMPREHENSIVE METABOLIC PANEL
ALT: 13 IU/L (ref 0–32)
AST: 17 IU/L (ref 0–40)
Albumin/Globulin Ratio: 1.6 (ref 1.2–2.2)
Albumin: 4.6 g/dL (ref 3.8–4.8)
Alkaline Phosphatase: 55 IU/L (ref 44–121)
BUN/Creatinine Ratio: 16 (ref 9–23)
BUN: 15 mg/dL (ref 6–20)
Bilirubin Total: 0.4 mg/dL (ref 0.0–1.2)
CO2: 21 mmol/L (ref 20–29)
Calcium: 9.9 mg/dL (ref 8.7–10.2)
Chloride: 100 mmol/L (ref 96–106)
Creatinine, Ser: 0.93 mg/dL (ref 0.57–1.00)
Globulin, Total: 2.9 g/dL (ref 1.5–4.5)
Glucose: 76 mg/dL (ref 65–99)
Potassium: 4.2 mmol/L (ref 3.5–5.2)
Sodium: 141 mmol/L (ref 134–144)
Total Protein: 7.5 g/dL (ref 6.0–8.5)
eGFR: 80 mL/min/{1.73_m2} (ref >59)

## 2021-04-03 LAB — CBC WITH DIFFERENTIAL/PLATELET
Basophils Absolute: 0 10*3/uL (ref 0.0–0.2)
Basos: 0 %
EOS (ABSOLUTE): 0 10*3/uL (ref 0.0–0.4)
Eos: 0 %
Hematocrit: 29.9 % — ABNORMAL LOW (ref 34.0–46.6)
Hemoglobin: 9 g/dL — ABNORMAL LOW (ref 11.1–15.9)
Immature Grans (Abs): 0 10*3/uL (ref 0.0–0.1)
Immature Granulocytes: 0 %
Lymphocytes Absolute: 1.6 10*3/uL (ref 0.7–3.1)
Lymphs: 15 %
MCH: 22.1 pg — ABNORMAL LOW (ref 26.6–33.0)
MCHC: 30.1 g/dL — ABNORMAL LOW (ref 31.5–35.7)
MCV: 73 fL — ABNORMAL LOW (ref 79–97)
Monocytes Absolute: 0.9 10*3/uL (ref 0.1–0.9)
Monocytes: 8 %
Neutrophils Absolute: 8.4 10*3/uL — ABNORMAL HIGH (ref 1.4–7.0)
Neutrophils: 77 %
Platelets: 415 10*3/uL (ref 150–450)
RBC: 4.08 x10E6/uL (ref 3.77–5.28)
RDW: 15.7 % — ABNORMAL HIGH (ref 11.7–15.4)
WBC: 11 10*3/uL — ABNORMAL HIGH (ref 3.4–10.8)

## 2021-04-03 LAB — VITAMIN B12: Vitamin B-12: 533 pg/mL (ref 232–1245)

## 2021-04-03 LAB — TSH: TSH: 0.557 u[IU]/mL (ref 0.450–4.500)

## 2021-04-03 LAB — VITAMIN D 25 HYDROXY (VIT D DEFICIENCY, FRACTURES): Vit D, 25-Hydroxy: 19.9 ng/mL — ABNORMAL LOW (ref 30.0–100.0)

## 2021-04-03 MED ORDER — VITAMIN D (ERGOCALCIFEROL) 1.25 MG (50000 UNIT) PO CAPS: 50000.0000 [IU] | ORAL_CAPSULE | ORAL | 0 refills | Status: DC

## 2021-04-03 MED ORDER — ONDANSETRON HCL 4 MG PO TABS: 4.0000 mg | ORAL_TABLET | Freq: Three times a day (TID) | ORAL | 0 refills | Status: DC | PRN

## 2021-04-03 NOTE — Progress Notes (Signed)
Orders Placed This Encounter  Procedures  . VITAMIN D 25 Hydroxy (Vit-D Deficiency, Fractures)  . CBC with Differential/Platelet  . Iron, TIBC and Ferritin Panel   Vitamin D insufficiency - Plan: Vitamin D, Ergocalciferol, (DRISDOL) 1.25 MG (50000 UNIT) CAPS capsule, VITAMIN D 25 Hydroxy (Vit-D Deficiency, Fractures)  Anemia, unspecified type - Plan: CBC with Differential/Platelet, Iron, TIBC and Ferritin Panel

## 2021-04-03 NOTE — Progress Notes (Signed)
Please add on TIBC, ferritin and iron level to current labs.  WBC is mildly elevated- neutrophils absolute mild increase, hemoglobin is 9- low - anemia adding on iron studies will wait for those results and then advise. Please verify if any bleeding ? Heavy vaginal bleeding ?  TSH for thyroid within normal limits.  CMP is within normal limits.   Vitamin  D is low, this can contribute to poor sleep and fatigue, will send in prescription for Vitamin D at 50,000 units by mouth once every 7 days/(once weekly) for 12 weeks. Advise recheck lab Vitamin D in 1-2 weeks after completing vitamin d prescription. Labs need to be scheduled.   Sent vitamin d to pharmacy, ordered future labs for when she completes 12 week course of vitamin D she can schedule labs 3 months.

## 2021-04-03 NOTE — Progress Notes (Signed)
No orders of the defined types were placed in this encounter.  Meds ordered this encounter  Medications  . ondansetron (ZOFRAN) 4 MG tablet    Sig: Take 1 tablet (4 mg total) by mouth every 8 (eight) hours as needed for nausea.    Dispense:  20 tablet    Refill:  0

## 2021-04-03 NOTE — Progress Notes (Signed)
Sent Zofran for a few days while she is adjusting to being back on her medication. Take only as needed and follow up if worsening at anytime.  Med ordered this encounter Medications  ondansetron (ZOFRAN) 4 MG tablet   Sig: Take 1 tablet (4 mg total) by mouth every 8 (eight) hours as needed for nausea.   Dispense:  20 tablet   Refill:  0

## 2021-04-03 NOTE — Progress Notes (Signed)
Noted  

## 2021-04-06 LAB — IRON AND TIBC
Iron Saturation: 4 % — CL (ref 15–55)
Iron: 17 ug/dL — ABNORMAL LOW (ref 27–159)
Total Iron Binding Capacity: 393 ug/dL (ref 250–450)
UIBC: 376 ug/dL (ref 131–425)

## 2021-04-06 LAB — SPECIMEN STATUS REPORT

## 2021-04-06 LAB — FERRITIN: Ferritin: 4 ng/mL — ABNORMAL LOW (ref 15–150)

## 2021-04-08 ENCOUNTER — Other Ambulatory Visit: Payer: Self-pay | Admitting: Adult Health

## 2021-04-08 DIAGNOSIS — D508 Other iron deficiency anemias: Secondary | ICD-10-CM

## 2021-04-08 MED ORDER — IRON (FERROUS SULFATE) 325 (65 FE) MG PO TABS: 325.0000 mg | ORAL_TABLET | ORAL | 0 refills | Status: DC

## 2021-04-08 NOTE — Progress Notes (Signed)
Meds ordered this encounter  Medications  . Iron, Ferrous Sulfate, 325 (65 Fe) MG TABS    Sig: Take 325 mg by mouth every other day.    Dispense:  90 tablet    Refill:  0

## 2021-04-08 NOTE — Progress Notes (Signed)
Orders Placed This Encounter  Procedures  . Ambulatory referral to Hematology / Oncology

## 2021-04-08 NOTE — Progress Notes (Signed)
CBC shows anemia,iron levels are low, will start iron by mouth once daily sent to pharmacy. Take with vitamin C per package to help with absorption of iron.  Stay hydrated, and may take colace if needed for any constipation side effects with the iron.  Vitamin D is low will send script to pharmacy take as directed recheck Vitamin D, Iron levels and CBC in 3 months.   Report any heavy bleeding vaginally or any other bleeding ?

## 2021-04-08 NOTE — Progress Notes (Signed)
Did place to hematology for iron management and possible iron infusion.

## 2021-04-23 ENCOUNTER — Ambulatory Visit: Payer: BC Managed Care – PPO | Admitting: Adult Health

## 2021-04-24 ENCOUNTER — Inpatient Hospital Stay: Payer: BC Managed Care – PPO

## 2021-04-24 ENCOUNTER — Encounter: Payer: Self-pay | Admitting: Internal Medicine

## 2021-04-24 ENCOUNTER — Other Ambulatory Visit: Payer: Self-pay

## 2021-04-24 ENCOUNTER — Inpatient Hospital Stay: Payer: BC Managed Care – PPO | Attending: Internal Medicine | Admitting: Internal Medicine

## 2021-04-24 DIAGNOSIS — Z9884 Bariatric surgery status: Secondary | ICD-10-CM

## 2021-04-24 DIAGNOSIS — Z8041 Family history of malignant neoplasm of ovary: Secondary | ICD-10-CM

## 2021-04-24 DIAGNOSIS — Z800 Family history of malignant neoplasm of digestive organs: Secondary | ICD-10-CM

## 2021-04-24 DIAGNOSIS — D508 Other iron deficiency anemias: Secondary | ICD-10-CM

## 2021-04-24 DIAGNOSIS — Z79899 Other long term (current) drug therapy: Secondary | ICD-10-CM

## 2021-04-24 DIAGNOSIS — I10 Essential (primary) hypertension: Secondary | ICD-10-CM

## 2021-04-24 DIAGNOSIS — K909 Intestinal malabsorption, unspecified: Secondary | ICD-10-CM | POA: Insufficient documentation

## 2021-04-24 DIAGNOSIS — Z87891 Personal history of nicotine dependence: Secondary | ICD-10-CM | POA: Diagnosis not present

## 2021-04-24 DIAGNOSIS — Z8249 Family history of ischemic heart disease and other diseases of the circulatory system: Secondary | ICD-10-CM

## 2021-04-24 DIAGNOSIS — E785 Hyperlipidemia, unspecified: Secondary | ICD-10-CM | POA: Diagnosis not present

## 2021-04-24 DIAGNOSIS — E611 Iron deficiency: Secondary | ICD-10-CM | POA: Insufficient documentation

## 2021-04-24 NOTE — Progress Notes (Signed)
[facility] CONSULT NOTE  Patient Care Team: Sherlene Shams, MD as PCP - General (Internal Medicine) Novella Olive, NP (Nurse Practitioner)  CHIEF COMPLAINTS/PURPOSE OF CONSULTATION: ANEMIA   HEMATOLOGY HISTORY:  # ANEMIA MAY 2022 [pcp-Hb-9; Iron sat-4%; Ferritin-4]   #Gastric bypass [ [>2015]]  HISTORY OF PRESENTING ILLNESS:  Kaitlyn Schmitt 40 y.o.  female has been referred to [place] for further evaluation/work-up for anemia.  Patient complains of ongoing fatigue; difficulty sleeping at night restless legs.  Patient had weight loss suspected to missed antidepressant for about 2 weeks.  However, she is gaining back.   Blood in stools: None Change in bowel habits- constipated x1; now improved.  Blood in urine: None Difficulty swallowing: None Abnormal weight loss: yes [? setraline missed doses] Iron supplementation:none Prior Blood transfusions:  Bariatric surgery: yes > 7 years EGD/Colonoscopy: none  Vaginal bleeding: None   Review of Systems  Constitutional: Positive for malaise/fatigue. Negative for chills, diaphoresis, fever and weight loss.  HENT: Negative for nosebleeds and sore throat.   Eyes: Negative for double vision.  Respiratory: Negative for cough, hemoptysis, sputum production, shortness of breath and wheezing.   Cardiovascular: Negative for chest pain, palpitations, orthopnea and leg swelling.  Gastrointestinal: Negative for abdominal pain, blood in stool, constipation, diarrhea, heartburn, melena, nausea and vomiting.  Genitourinary: Negative for dysuria, frequency and urgency.  Musculoskeletal: Negative for back pain and joint pain.  Skin: Negative.  Negative for itching and rash.  Neurological: Positive for tingling. Negative for dizziness, focal weakness, weakness and headaches.  Endo/Heme/Allergies: Does not bruise/bleed easily.  Psychiatric/Behavioral: Negative for depression. The patient has insomnia. The patient is not nervous/anxious.      MEDICAL HISTORY:  Past Medical History:  Diagnosis Date  . GERD (gastroesophageal reflux disease)   . Hx of migraines   . Hyperlipidemia   . Hypertension     SURGICAL HISTORY: Past Surgical History:  Procedure Laterality Date  . CHOLECYSTECTOMY  2012  . GASTRIC BYPASS  2013  . TONSILLECTOMY AND ADENOIDECTOMY  2014    SOCIAL HISTORY: Social History   Socioeconomic History  . Marital status: Single    Spouse name: Not on file  . Number of children: 1  . Years of education: 52  . Highest education level: Not on file  Occupational History  . Occupation: Child psychotherapist: [employer]  Tobacco Use  . Smoking status: Former Smoker    Quit date: 05/18/2014    Years since quitting: 6.9  . Smokeless tobacco: Never Used  Vaping Use  . Vaping Use: Never used  Substance and Sexual Activity  . Alcohol use: Yes    Alcohol/week: 0.0 standard drinks    Comment: socially  . Drug use: No  . Sexual activity: Yes  Other Topics Concern  . Not on file  Social History Narrative   Kaitlyn Schmitt grew up in [place]. She is single and has a son Kaitlyn Alexanders). They have a dog (Zoe). She works in Designer, jewellery at [employer]. She spends most of her spare time with her son. She enjoys outdoor activities.      Caffeine - 20 oz daily   Exercise - Daily walking.          Social Determinants of Health   Financial Resource Strain: Not on file  Food Insecurity: Not on file  Transportation Needs: Not on file  Physical Activity: Not on file  Stress: Not on file  Social Connections: Not on file  Intimate Partner Violence: Not on file    FAMILY HISTORY: Family History  Problem Relation Age of Onset  . Arthritis Father   . Hyperlipidemia Father   . Hypertension Father   . Arthritis Paternal Grandmother   . Heart disease Paternal Grandmother   . Hypertension Paternal Grandmother   . Diabetes Paternal Grandmother   . Ovarian cancer Maternal Grandmother 60  . Rectal cancer Maternal  Grandfather 60    ALLERGIES:  has No Known Allergies.  MEDICATIONS:  Current Outpatient Medications  Medication Sig Dispense Refill  . HYDROcodone-acetaminophen (NORCO/VICODIN) 5-325 MG tablet Take 1 tablet by mouth 2 (two) times daily as needed.    Marland Kitchen omeprazole (PRILOSEC) 10 MG capsule Take 10 mg by mouth daily. Reported on 03/27/2016    . sertraline (ZOLOFT) 100 MG tablet Take 1 tablet (100 mg total) by mouth daily. 90 tablet 0  . tiZANidine (ZANAFLEX) 2 MG tablet Take 2-4 mg by mouth at bedtime as needed.    . hydrOXYzine (ATARAX/VISTARIL) 10 MG tablet Take 1 tablet (10 mg total) by mouth 3 (three) times daily as needed (will cause drowsiness). (Patient not taking: Reported on 04/24/2021) 90 tablet 0  . Iron, Ferrous Sulfate, 325 (65 Fe) MG TABS Take 325 mg by mouth every other day. (Patient not taking: Reported on 04/24/2021) 90 tablet 0  . ondansetron (ZOFRAN) 4 MG tablet Take 1 tablet (4 mg total) by mouth every 8 (eight) hours as needed for nausea. (Patient not taking: Reported on 04/24/2021) 20 tablet 0   No current facility-administered medications for this visit.      PHYSICAL EXAMINATION:   Vitals:   04/24/21 1104  BP: 134/87  Pulse: 78  Resp: 16  Temp: 99.1 F (37.3 C)  SpO2: 99%   Filed Weights   04/24/21 1104  Weight: 191 lb 3.2 oz (86.7 kg)    Physical Exam HENT:     Head: Normocephalic and atraumatic.     Mouth/Throat:     Pharynx: No oropharyngeal exudate.  Eyes:     Pupils: Pupils are equal, round, and reactive to light.  Cardiovascular:     Rate and Rhythm: Normal rate and regular rhythm.  Pulmonary:     Effort: No respiratory distress.     Breath sounds: No wheezing.  Abdominal:     General: Bowel sounds are normal. There is no distension.     Palpations: Abdomen is soft. There is no mass.     Tenderness: There is no abdominal tenderness. There is no guarding or rebound.  Musculoskeletal:        General: No tenderness. Normal range of motion.      Cervical back: Normal range of motion and neck supple.  Skin:    General: Skin is warm.  Neurological:     Mental Status: She is alert and oriented to person, place, and time.  Psychiatric:        Mood and Affect: Affect normal.     LABORATORY DATA:  I have reviewed the data as listed Lab Results  Component Value Date   WBC 11.0 (H) 04/02/2021   HGB 9.0 (L) 04/02/2021   HCT 29.9 (L) 04/02/2021   MCV 73 (L) 04/02/2021   PLT 415 04/02/2021   Recent Labs    04/02/21 1347  NA 141  K 4.2  CL 100  CO2 21  GLUCOSE 76  BUN 15  CREATININE 0.93  CALCIUM 9.9  PROT 7.5  ALBUMIN 4.6  AST 17  ALT 13  ALKPHOS 55  BILITOT 0.4     No results found.  Iron deficiency #Iron deficiency-anemia symptomatic [May hemoglobin 9; ferritin 2-PCP] secondary to malabsorption /gastric bypass. Recommend IV iron infusion. Discussed the potential acute infusion reactions with IV iron; which are quite rare.  Patient understands the risk; will proceed with infusions.  Patient understand that she will likely need maintenance IV iron in the future also.  #May 2022-within normal/no deficiency.  Thank you,Dr.Tullo for allowing me to participate in the care of your pleasant patient. Please do not hesitate to contact me with questions or concerns in the interim.  # DISPOSITION: # venofer weekly x 4  # follow up in 2 months- MD; labs- cbc/possible veonofer-Dr.B  # 45 minutes face-to-face with the patient discussing the above plan of care; more than 50% of time spent on prognosis/ natural history; counseling and coordination.    All questions were answered. The patient knows to call the clinic with any problems, questions or concerns.    Earna Coder, MD 04/24/2021 8:38 PM

## 2021-04-24 NOTE — Progress Notes (Signed)
About two weeks ago her dog knocked her down, knocked to her dresser and pt fell on the ground. Bruising on her rt leg, when walking or stadning knee really bothers her so she keeps her knee brace on.

## 2021-04-24 NOTE — Assessment & Plan Note (Addendum)
#  Iron deficiency-anemia symptomatic [May hemoglobin 9; ferritin 2-PCP] secondary to malabsorption /gastric bypass. Recommend IV iron infusion. Discussed the potential acute infusion reactions with IV iron; which are quite rare.  Patient understands the risk; will proceed with infusions.  Patient understand that she will likely need maintenance IV iron in the future also.  #May 2022-within normal/no deficiency.  Thank you,Dr.Tullo for allowing me to participate in the care of your pleasant patient. Please do not hesitate to contact me with questions or concerns in the interim.  # DISPOSITION: # venofer weekly x 4  # follow up in 2 months- MD; labs- cbc/possible veonofer-Dr.B  # 45 minutes face-to-face with the patient discussing the above plan of care; more than 50% of time spent on prognosis/ natural history; counseling and coordination.

## 2021-04-26 ENCOUNTER — Other Ambulatory Visit: Payer: Self-pay

## 2021-04-26 ENCOUNTER — Encounter: Payer: Self-pay | Admitting: Adult Health

## 2021-04-26 ENCOUNTER — Ambulatory Visit: Payer: BC Managed Care – PPO | Admitting: Adult Health

## 2021-04-26 VITALS — BP 116/72 | HR 71 | Temp 98.1°F | Ht 67.99 in | Wt 194.2 lb

## 2021-04-26 DIAGNOSIS — G4700 Insomnia, unspecified: Secondary | ICD-10-CM

## 2021-04-26 DIAGNOSIS — F419 Anxiety disorder, unspecified: Secondary | ICD-10-CM

## 2021-04-26 DIAGNOSIS — K219 Gastro-esophageal reflux disease without esophagitis: Secondary | ICD-10-CM | POA: Insufficient documentation

## 2021-04-26 MED ORDER — TRAZODONE HCL 50 MG PO TABS: 25.0000 mg | ORAL_TABLET | Freq: Every evening | ORAL | 0 refills | Status: DC | PRN

## 2021-04-26 MED ORDER — OMEPRAZOLE 10 MG PO CPDR: 20.0000 mg | DELAYED_RELEASE_CAPSULE | Freq: Every day | ORAL | 2 refills | Status: AC

## 2021-04-26 NOTE — Patient Instructions (Signed)
[email or web address]">  Generalized Anxiety Disorder, Adult Generalized anxiety disorder (GAD) is a mental health condition. Unlike normal worries, anxiety related to GAD is not triggered by a specific event. These worries do not fade or get better with time. GAD interferes with relationships, work, and school. GAD symptoms can vary from mild to severe. People with severe GAD can have intense waves of anxiety with physical symptoms that are similar to panic attacks. What are the causes? The exact cause of GAD is not known, but the following are believed to have an impact: Differences in natural brain chemicals. Genes passed down from parents to children. Differences in the way threats are perceived. Development during childhood. Personality. What increases the risk? The following factors may make you more likely to develop this condition: Being female. Having a family history of anxiety disorders. Being very shy. Experiencing very stressful life events, such as the death of a loved one. Having a very stressful family environment. What are the signs or symptoms? People with GAD often worry excessively about many things in their lives, such as their health and family. Symptoms may also include: Mental and emotional symptoms: Worrying excessively about natural disasters. Fear of being late. Difficulty concentrating. Fears that others are judging your performance. Physical symptoms: Fatigue. Headaches, muscle tension, muscle twitches, trembling, or feeling shaky. Feeling like your heart is pounding or beating very fast. Feeling out of breath or like you cannot take a deep breath. Having trouble falling asleep or staying asleep, or experiencing restlessness. Sweating. Nausea, diarrhea, or irritable bowel syndrome (IBS). Behavioral symptoms: Experiencing erratic moods or irritability. Avoidance of new situations. Avoidance of people. Extreme difficulty making decisions. How is this  diagnosed? This condition is diagnosed based on your symptoms and medical history. You will also have a physical exam. Your health care provider may perform tests to rule out other possible causes of your symptoms. To be diagnosed with GAD, a person must have anxiety that: Is out of his or her control. Affects several different aspects of his or her life, such as work and relationships. Causes distress that makes him or her unable to take part in normal activities. Includes at least three symptoms of GAD, such as restlessness, fatigue, trouble concentrating, irritability, muscle tension, or sleep problems. Before your health care provider can confirm a diagnosis of GAD, these symptoms must be present more days than they are not, and they must last for 6 months or longer. How is this treated? This condition may be treated with: Medicine. Antidepressant medicine is usually prescribed for long-term daily control. Anti-anxiety medicines may be added in severe cases, especially when panic attacks occur. Talk therapy (psychotherapy). Certain types of talk therapy can be helpful in treating GAD by providing support, education, and guidance. Options include: Cognitive behavioral therapy (CBT). People learn coping skills and self-calming techniques to ease their physical symptoms. They learn to identify unrealistic thoughts and behaviors and to replace them with more appropriate thoughts and behaviors. Acceptance and commitment therapy (ACT). This treatment teaches people how to be mindful as a way to cope with unwanted thoughts and feelings. Biofeedback. This process trains you to manage your body's response (physiological response) through breathing techniques and relaxation methods. You will work with a therapist while machines are used to monitor your physical symptoms. Stress management techniques. These include yoga, meditation, and exercise. A mental health specialist can help determine which treatment  is best for you. Some people see improvement with one type of therapy. However, other people require  a combination of therapies.   Follow these instructions at home: Lifestyle Maintain a consistent routine and schedule. Anticipate stressful situations. Create a plan, and allow extra time to work with your plan. Practice stress management or self-calming techniques that you have learned from your therapist or your health care provider. General instructions Take over-the-counter and prescription medicines only as told by your health care provider. Understand that you are likely to have setbacks. Accept this and be kind to yourself as you persist to take better care of yourself. Recognize and accept your accomplishments, even if you judge them as small. Keep all follow-up visits as told by your health care provider. This is important. Contact a health care provider if: Your symptoms do not get better. Your symptoms get worse. You have signs of depression, such as: A persistently sad or irritable mood. Loss of enjoyment in activities that used to bring you joy. Change in weight or eating. Changes in sleeping habits. Avoiding friends or family members. Loss of energy for normal tasks. Feelings of guilt or worthlessness. Get help right away if: You have serious thoughts about hurting yourself or others. If you ever feel like you may hurt yourself or others, or have thoughts about taking your own life, get help right away. Go to your nearest emergency department or: Call your local emergency services (911 in the [place] Call a suicide crisis helpline, such as the National Suicide Prevention Lifeline at 365 461 2107. This is open 24 hours a day in the [place] Text the Crisis Text Line at 919-710-5007 (in the [place] Summary Generalized anxiety disorder (GAD) is a mental health condition that involves worry that is not triggered by a specific event. People with GAD often worry excessively about many  things in their lives, such as their health and family. GAD may cause symptoms such as restlessness, trouble concentrating, sleep problems, frequent sweating, nausea, diarrhea, headaches, and trembling or muscle twitching. A mental health specialist can help determine which treatment is best for you. Some people see improvement with one type of therapy. However, other people require a combination of therapies. This information is not intended to replace advice given to you by your health care provider. Make sure you discuss any questions you have with your health care provider. Document Revised: 08/25/2019 Document Reviewed: 08/25/2019 Elsevier Patient Education  2021 [employer]. Insomnia Insomnia is a sleep disorder that makes it difficult to fall asleep or stay asleep. Insomnia can cause fatigue, low energy, difficulty concentrating, mood swings, and poor performance at work or school. There are three different ways to classify insomnia: Difficulty falling asleep. Difficulty staying asleep. Waking up too early in the morning. Any type of insomnia can be long-term (chronic) or short-term (acute). Both are common. Short-term insomnia usually lasts for three months or less. Chronic insomnia occurs at least three times a week for longer than three months. What are the causes? Insomnia may be caused by another condition, situation, or substance, such as: Anxiety. Certain medicines. Gastroesophageal reflux disease (GERD) or other gastrointestinal conditions. Asthma or other breathing conditions. Restless legs syndrome, sleep apnea, or other sleep disorders. Chronic pain. Menopause. Stroke. Abuse of alcohol, tobacco, or illegal drugs. Mental health conditions, such as depression. Caffeine. Neurological disorders, such as Alzheimer's disease. An overactive thyroid (hyperthyroidism). Sometimes, the cause of insomnia may not be known. What increases the risk? Risk factors for insomnia  include: Gender. Women are affected more often than men. Age. Insomnia is more common as you get older. Stress. Lack of  exercise. Irregular work schedule or working night shifts. Traveling between different time zones. Certain medical and mental health conditions. What are the signs or symptoms? If you have insomnia, the main symptom is having trouble falling asleep or having trouble staying asleep. This may lead to other symptoms, such as: Feeling fatigued or having low energy. Feeling nervous about going to sleep. Not feeling rested in the morning. Having trouble concentrating. Feeling irritable, anxious, or depressed. How is this diagnosed? This condition may be diagnosed based on: Your symptoms and medical history. Your health care provider may ask about: Your sleep habits. Any medical conditions you have. Your mental health. A physical exam. How is this treated? Treatment for insomnia depends on the cause. Treatment may focus on treating an underlying condition that is causing insomnia. Treatment may also include: Medicines to help you sleep. Counseling or therapy. Lifestyle adjustments to help you sleep better. Follow these instructions at home: Eating and drinking Limit or avoid alcohol, caffeinated beverages, and cigarettes, especially close to bedtime. These can disrupt your sleep. Do not eat a large meal or eat spicy foods right before bedtime. This can lead to digestive discomfort that can make it hard for you to sleep.   Sleep habits Keep a sleep diary to help you and your health care provider figure out what could be causing your insomnia. Write down: When you sleep. When you wake up during the night. How well you sleep. How rested you feel the next day. Any side effects of medicines you are taking. What you eat and drink. Make your bedroom a dark, comfortable place where it is easy to fall asleep. Put up shades or blackout curtains to block light from  outside. Use a white noise machine to block noise. Keep the temperature cool. Limit screen use before bedtime. This includes: Watching TV. Using your smartphone, tablet, or computer. Stick to a routine that includes going to bed and waking up at the same times every day and night. This can help you fall asleep faster. Consider making a quiet activity, such as reading, part of your nighttime routine. Try to avoid taking naps during the day so that you sleep better at night. Get out of bed if you are still awake after 15 minutes of trying to sleep. Keep the lights down, but try reading or doing a quiet activity. When you feel sleepy, go back to bed.   General instructions Take over-the-counter and prescription medicines only as told by your health care provider. Exercise regularly, as told by your health care provider. Avoid exercise starting several hours before bedtime. Use relaxation techniques to manage stress. Ask your health care provider to suggest some techniques that may work well for you. These may include: Breathing exercises. Routines to release muscle tension. Visualizing peaceful scenes. Make sure that you drive carefully. Avoid driving if you feel very sleepy. Keep all follow-up visits as told by your health care provider. This is important. Contact a health care provider if: You are tired throughout the day. You have trouble in your daily routine due to sleepiness. You continue to have sleep problems, or your sleep problems get worse. Get help right away if: You have serious thoughts about hurting yourself or someone else. If you ever feel like you may hurt yourself or others, or have thoughts about taking your own life, get help right away. You can go to your nearest emergency department or call: Your local emergency services (911 in the [place] A suicide crisis helpline,  such as the National Suicide Prevention Lifeline at 7805735805. This is open 24 hours a  day. Summary Insomnia is a sleep disorder that makes it difficult to fall asleep or stay asleep. Insomnia can be long-term (chronic) or short-term (acute). Treatment for insomnia depends on the cause. Treatment may focus on treating an underlying condition that is causing insomnia. Keep a sleep diary to help you and your health care provider figure out what could be causing your insomnia. This information is not intended to replace advice given to you by your health care provider. Make sure you discuss any questions you have with your health care provider. Document Revised: 09/14/2020 Document Reviewed: 09/14/2020 Elsevier Patient Education  2021 [employer] Inc. Trazodone Tablets What is this medicine? TRAZODONE (TRAZ oh done) is used to treat depression. This medicine may be used for other purposes; ask your health care provider or pharmacist if you have questions. COMMON BRAND NAME(S): Desyrel What should I tell my health care provider before I take this medicine? They need to know if you have any of these conditions: attempted suicide or thinking about it bipolar disorder bleeding problems glaucoma heart disease, or previous heart attack irregular heart beat kidney or liver disease low levels of sodium in the blood an unusual or allergic reaction to trazodone, other medicines, foods, dyes or preservatives pregnant or trying to get pregnant breast-feeding How should I use this medicine? Take this medicine by mouth with a glass of water. Follow the directions on the prescription label. Take this medicine shortly after a meal or a light snack. Take your medicine at regular intervals. Do not take your medicine more often than directed. Do not stop taking this medicine suddenly except upon the advice of your doctor. Stopping this medicine too quickly may cause serious side effects or your condition may worsen. A special MedGuide will be given to you by the pharmacist with each prescription  and refill. Be sure to read this information carefully each time. Talk to your pediatrician regarding the use of this medicine in children. Special care may be needed. Overdosage: If you think you have taken too much of this medicine contact a poison control center or emergency room at once. NOTE: This medicine is only for you. Do not share this medicine with others. What if I miss a dose? If you miss a dose, take it as soon as you can. If it is almost time for your next dose, take only that dose. Do not take double or extra doses. What may interact with this medicine? Do not take this medicine with any of the following medications: certain medicines for fungal infections like fluconazole, itraconazole, ketoconazole, posaconazole, voriconazole cisapride dronedarone linezolid MAOIs like Carbex, Eldepryl, Marplan, Nardil, and Parnate mesoridazine methylene blue (injected into a vein) pimozide saquinavir thioridazine This medicine may also interact with the following medications: alcohol antiviral medicines for HIV or AIDS aspirin and aspirin-like medicines barbiturates like phenobarbital certain medicines for blood pressure, heart disease, irregular heart beat certain medicines for depression, anxiety, or psychotic disturbances certain medicines for migraine headache like almotriptan, eletriptan, frovatriptan, naratriptan, rizatriptan, sumatriptan, zolmitriptan certain medicines for seizures like carbamazepine and phenytoin certain medicines for sleep certain medicines that treat or prevent blood clots like dalteparin, enoxaparin, warfarin digoxin fentanyl lithium NSAIDS, medicines for pain and inflammation, like ibuprofen or naproxen other medicines that prolong the QT interval (cause an abnormal heart rhythm) like dofetilide rasagiline supplements like St. John's wort, kava kava, valerian tramadol tryptophan This list may not describe  all possible interactions. Give your health  care provider a list of all the medicines, herbs, non-prescription drugs, or dietary supplements you use. Also tell them if you smoke, drink alcohol, or use illegal drugs. Some items may interact with your medicine. What should I watch for while using this medicine? Tell your doctor if your symptoms do not get better or if they get worse. Visit your doctor or health care professional for regular checks on your progress. Because it may take several weeks to see the full effects of this medicine, it is important to continue your treatment as prescribed by your doctor. Patients and their families should watch out for new or worsening thoughts of suicide or depression. Also watch out for sudden changes in feelings such as feeling anxious, agitated, panicky, irritable, hostile, aggressive, impulsive, severely restless, overly excited and hyperactive, or not being able to sleep. If this happens, especially at the beginning of treatment or after a change in dose, call your health care professional. Bonita QuinYou may get drowsy or dizzy. Do not drive, use machinery, or do anything that needs mental alertness until you know how this medicine affects you. Do not stand or sit up quickly, especially if you are an older patient. This reduces the risk of dizzy or fainting spells. Alcohol may interfere with the effect of this medicine. Avoid alcoholic drinks. This medicine may cause dry eyes and blurred vision. If you wear contact lenses you may feel some discomfort. Lubricating drops may help. See your eye doctor if the problem does not go away or is severe. Your mouth may get dry. Chewing sugarless gum, sucking hard candy and drinking plenty of water may help. Contact your doctor if the problem does not go away or is severe. What side effects may I notice from receiving this medicine? Side effects that you should report to your doctor or health care professional as soon as possible: allergic reactions like skin rash, itching or  hives, swelling of the face, lips, or tongue elevated mood, decreased need for sleep, racing thoughts, impulsive behavior confusion fast, irregular heartbeat feeling faint or lightheaded, falls feeling agitated, angry, or irritable loss of balance or coordination painful or prolonged erections restlessness, pacing, inability to keep still suicidal thoughts or other mood changes tremors trouble sleeping seizures unusual bleeding or bruising Side effects that usually do not require medical attention (report to your doctor or health care professional if they continue or are bothersome): change in sex drive or performance change in appetite or weight constipation headache muscle aches or pains nausea This list may not describe all possible side effects. Call your doctor for medical advice about side effects. You may report side effects to FDA at 1-800-FDA-1088. Where should I keep my medicine? Keep out of the reach of children. Store at room temperature between 15 and 30 degrees C (59 to 86 degrees F). Protect from light. Keep container tightly closed. Throw away any unused medicine after the expiration date. NOTE: This sheet is a summary. It may not cover all possible information. If you have questions about this medicine, talk to your doctor, pharmacist, or health care provider.  2021 Elsevier/[employer] Standard (2020-09-25 14:46:11)

## 2021-04-26 NOTE — Progress Notes (Signed)
Established Patient Office Visit  Subjective:  Patient ID: Kaitlyn Schmitt, female    DOB: Dec 10, 1980  Age: 40 y.o. MRN: [id number]  CC:  Chief Complaint  Patient presents with   Follow-up    HPI [name] presents for follow up on Anxiety. She is back on her Zoloft one hundred milligrams.  She is not taking Atarax and she also has tried melatonin, Zyquil.   She went to hematology saw Rogue Bussing and will start iron infusions and will start iron infusions next wee.   She is feeling much better other than not sleeping well.   Patient  denies any fever, body aches,chills, rash, chest pain, shortness of breath, nausea, vomiting, or diarrhea.  Denies dizziness, lightheadedness, pre syncopal or syncopal episodes.       Past Medical History:  Diagnosis Date   GERD (gastroesophageal reflux disease)    Hx of migraines    Hyperlipidemia    Hypertension     Past Surgical History:  Procedure Laterality Date   CHOLECYSTECTOMY  2012   GASTRIC BYPASS  2013   TONSILLECTOMY AND ADENOIDECTOMY  2014    Family History  Problem Relation Age of Onset   Arthritis Father    Hyperlipidemia Father    Hypertension Father    Arthritis Paternal Grandmother    Heart disease Paternal Grandmother    Hypertension Paternal Grandmother    Diabetes Paternal Grandmother    Ovarian cancer Maternal Grandmother 60   Rectal cancer Maternal Grandfather 43    Social History   Socioeconomic History   Marital status: Single    Spouse name: Not on file   Number of children: 1   Years of education: 12   Highest education level: Not on file  Occupational History   Occupation: Interior and spatial designer: [employer]  Tobacco Use   Smoking status: Former    Pack years: 0.00    Types: Cigarettes    Quit date: 05/18/2014    Years since quitting: 6.9   Smokeless tobacco: Never  Vaping Use   Vaping Use: Never used  Substance and Sexual Activity   Alcohol use: Yes    Alcohol/week: 0.0 standard  drinks    Comment: socially   Drug use: No   Sexual activity: Yes  Other Topics Concern   Not on file  Social History Narrative   Kaitlyn Schmitt grew up in [place]. She is single and has a son Larkin Ina). They have a dog (Zoe). She works in Press photographer at [employer]. She spends most of her spare time with her son. She enjoys outdoor activities.      Caffeine - 20 oz daily   Exercise - Daily walking.          Social Determinants of Health   Financial Resource Strain: Not on file  Food Insecurity: Not on file  Transportation Needs: Not on file  Physical Activity: Not on file  Stress: Not on file  Social Connections: Not on file  Intimate Partner Violence: Not on file    Outpatient Medications Prior to Visit  Medication Sig Dispense Refill   HYDROcodone-acetaminophen (NORCO/VICODIN) 5-325 MG tablet Take 1 tablet by mouth 2 (two) times daily as needed.     sertraline (ZOLOFT) 100 MG tablet Take 1 tablet (100 mg total) by mouth daily. 90 tablet 0   tiZANidine (ZANAFLEX) 2 MG tablet Take 2-4 mg by mouth at bedtime as needed.     omeprazole (PRILOSEC) 10 MG capsule Take 10  mg by mouth daily. Reported on 03/27/2016     hydrOXYzine (ATARAX/VISTARIL) 10 MG tablet Take 1 tablet (10 mg total) by mouth 3 (three) times daily as needed (will cause drowsiness). (Patient not taking: Reported on 04/26/2021) 90 tablet 0   Iron, Ferrous Sulfate, 325 (65 Fe) MG TABS Take 325 mg by mouth every other day. (Patient not taking: Reported on 04/26/2021) 90 tablet 0   ondansetron (ZOFRAN) 4 MG tablet Take 1 tablet (4 mg total) by mouth every 8 (eight) hours as needed for nausea. (Patient not taking: Reported on 04/26/2021) 20 tablet 0   No facility-administered medications prior to visit.    No Known Allergies  ROS Review of Systems  Gastrointestinal: Negative.   Genitourinary: Negative.   Musculoskeletal: Negative.   Psychiatric/Behavioral:  Positive for sleep disturbance. Negative for agitation, behavioral  problems, confusion, decreased concentration, dysphoric mood, hallucinations, self-injury and suicidal ideas. The patient is nervous/anxious. The patient is not hyperactive.      Objective:    Physical Exam Constitutional:      General: She is not in acute distress.    Appearance: She is normal weight. She is not ill-appearing, toxic-appearing or diaphoretic.  HENT:     Right Ear: External ear normal.     Left Ear: External ear normal.     Nose: No congestion or rhinorrhea.     Mouth/Throat:     Mouth: Mucous membranes are moist.     Pharynx: Oropharynx is clear. No oropharyngeal exudate or posterior oropharyngeal erythema.  Eyes:     Conjunctiva/sclera: Conjunctivae normal.  Cardiovascular:     Rate and Rhythm: Normal rate and regular rhythm.     Pulses: Normal pulses.     Heart sounds: Normal heart sounds. No murmur heard.   No friction rub. No gallop.  Pulmonary:     Effort: Pulmonary effort is normal. No respiratory distress.     Breath sounds: Normal breath sounds. No stridor. No wheezing, rhonchi or rales.  Chest:     Chest wall: No tenderness.  Abdominal:     Palpations: Abdomen is soft.  Neurological:     Mental Status: She is oriented to person, place, and time.  Psychiatric:        Mood and Affect: Mood normal.        Behavior: Behavior normal.        Thought Content: Thought content normal.        Judgment: Judgment normal.     Comments: Not tearful like at last visit.     BP 116/72   Pulse 71   Temp 98.1 F (36.7 C)   Ht 5' 7.99" (1.727 m)   Wt 194 lb 3.2 oz (88.1 kg)   LMP 03/29/2021   SpO2 99%   BMI 29.53 kg/m  Wt Readings from Last 3 Encounters:  04/26/21 194 lb 3.2 oz (88.1 kg)  04/24/21 191 lb 3.2 oz (86.7 kg)  04/02/21 196 lb 12.8 oz (89.3 kg)     Health Maintenance Due  Topic Date Due   HIV Screening  Never done   Hepatitis C Screening  Never done   PAP SMEAR-Modifier  09/30/2016   COVID-19 Vaccine (3 - Booster for Moderna series)  08/29/2020    There are no preventive care reminders to display for this patient.  Lab Results  Component Value Date   TSH 0.557 04/02/2021   Lab Results  Component Value Date   WBC 11.0 (H) 04/02/2021   HGB 9.0 (L) 04/02/2021  HCT 29.9 (L) 04/02/2021   MCV 73 (L) 04/02/2021   PLT 415 04/02/2021   Lab Results  Component Value Date   NA 141 04/02/2021   K 4.2 04/02/2021   CO2 21 04/02/2021   GLUCOSE 76 04/02/2021   BUN 15 04/02/2021   CREATININE 0.93 04/02/2021   BILITOT 0.4 04/02/2021   ALKPHOS 55 04/02/2021   AST 17 04/02/2021   ALT 13 04/02/2021   PROT 7.5 04/02/2021   ALBUMIN 4.6 04/02/2021   CALCIUM 9.9 04/02/2021   ANIONGAP 7 07/29/2012   EGFR 80 04/02/2021   Lab Results  Component Value Date   CHOL 156 03/27/2016   Lab Results  Component Value Date   HDL 36 (L) 03/27/2016   Lab Results  Component Value Date   LDLCALC 103 (H) 03/27/2016   Lab Results  Component Value Date   TRIG 84 03/27/2016   Lab Results  Component Value Date   CHOLHDL 4.3 03/27/2016   No results found for: HGBA1C    Assessment & Plan:   Problem List Items Addressed This Visit       Digestive   Gastroesophageal reflux disease   Relevant Medications   omeprazole (PRILOSEC) 10 MG capsule     Other   Anxiety   Relevant Medications   traZODone (DESYREL) 50 MG tablet   Other Visit Diagnoses     Insomnia, unspecified type    -  Primary   Relevant Medications   traZODone (DESYREL) 50 MG tablet       1. Insomnia, unspecified type Will try Trazodone for sleep.  - traZODone (DESYREL) 50 MG tablet; Take 0.5-1 tablets (25-50 mg total) by mouth at bedtime as needed for sleep.  Dispense: 30 tablet; Refill: 0  2. Anxiety Continue current Zoloft 100 mg po qd.  - traZODone (DESYREL) 50 MG tablet; Take 0.5-1 tablets (25-50 mg total) by mouth at bedtime as needed for sleep.  Dispense: 30 tablet; Refill: 0  3. Gastroesophageal reflux disease, unspecified whether  esophagitis present Refilled omeprazole per request was buying over the counter, symptoms stable.   Meds ordered this encounter  Medications   traZODone (DESYREL) 50 MG tablet    Sig: Take 0.5-1 tablets (25-50 mg total) by mouth at bedtime as needed for sleep.    Dispense:  30 tablet    Refill:  0   omeprazole (PRILOSEC) 10 MG capsule    Sig: Take 2 capsules (20 mg total) by mouth daily. Reported on 03/27/2016    Dispense:  90 capsule    Refill:  2   Red Flags discussed. The patient was given clear instructions to go to ER or return to medical center if any red flags develop, symptoms do not improve, worsen or new problems develop. They verbalized understanding.  Follow-up: Return in about 3 months (around 07/27/2021), or if symptoms worsen or fail to improve, for at any time for any worsening symptoms, Go to Emergency room/ urgent care if worse.    Marcille Buffy, FNP

## 2021-05-04 ENCOUNTER — Inpatient Hospital Stay: Payer: BC Managed Care – PPO

## 2021-05-04 ENCOUNTER — Other Ambulatory Visit: Payer: Self-pay

## 2021-05-04 VITALS — BP 128/80 | HR 70

## 2021-05-04 DIAGNOSIS — E785 Hyperlipidemia, unspecified: Secondary | ICD-10-CM | POA: Diagnosis not present

## 2021-05-04 DIAGNOSIS — Z800 Family history of malignant neoplasm of digestive organs: Secondary | ICD-10-CM | POA: Diagnosis not present

## 2021-05-04 DIAGNOSIS — D508 Other iron deficiency anemias: Secondary | ICD-10-CM | POA: Diagnosis not present

## 2021-05-04 DIAGNOSIS — K909 Intestinal malabsorption, unspecified: Secondary | ICD-10-CM | POA: Diagnosis not present

## 2021-05-04 DIAGNOSIS — Z9884 Bariatric surgery status: Secondary | ICD-10-CM | POA: Diagnosis not present

## 2021-05-04 DIAGNOSIS — Z79899 Other long term (current) drug therapy: Secondary | ICD-10-CM | POA: Diagnosis not present

## 2021-05-04 DIAGNOSIS — Z8249 Family history of ischemic heart disease and other diseases of the circulatory system: Secondary | ICD-10-CM | POA: Diagnosis not present

## 2021-05-04 DIAGNOSIS — Z87891 Personal history of nicotine dependence: Secondary | ICD-10-CM | POA: Diagnosis not present

## 2021-05-04 DIAGNOSIS — E611 Iron deficiency: Secondary | ICD-10-CM

## 2021-05-04 DIAGNOSIS — I10 Essential (primary) hypertension: Secondary | ICD-10-CM | POA: Diagnosis not present

## 2021-05-04 DIAGNOSIS — Z8041 Family history of malignant neoplasm of ovary: Secondary | ICD-10-CM | POA: Diagnosis not present

## 2021-05-04 MED ORDER — SODIUM CHLORIDE 0.9 % IV SOLN
Freq: Once | INTRAVENOUS | Status: AC
  Filled 2021-05-04: qty 250

## 2021-05-04 MED ORDER — SODIUM CHLORIDE 0.9 % IV SOLN: 200.0000 mg | Freq: Once | INTRAVENOUS | Status: DC

## 2021-05-04 MED ORDER — IRON SUCROSE 20 MG/ML IV SOLN
200.0000 mg | Freq: Once | INTRAVENOUS | Status: AC
Start: 2021-05-04 — End: 2021-05-04
  Administered 2021-05-04: 200 mg via INTRAVENOUS
  Filled 2021-05-04: qty 10

## 2021-05-04 NOTE — Patient Instructions (Signed)
[facility]  Discharge Instructions: Thank you for choosing [facility] to provide your oncology and hematology care.  If you have a lab appointment with the Cancer Center, please go directly to the Cancer Center and check in at the registration area.  Wear comfortable clothing and clothing appropriate for easy access to any Portacath or PICC line.   We strive to give you quality time with your provider. You may need to reschedule your appointment if you arrive late (15 or more minutes).  Arriving late affects you and other patients whose appointments are after yours.  Also, if you miss three or more appointments without notifying the office, you may be dismissed from the clinic at the provider's discretion.      For prescription refill requests, have your pharmacy contact our office and allow 72 hours for refills to be completed.    Today you received the following : Venofer   To help prevent nausea and vomiting after your treatment, we encourage you to take your nausea medication as directed.  BELOW ARE SYMPTOMS THAT SHOULD BE REPORTED IMMEDIATELY: . *FEVER GREATER THAN 100.4 F (38 C) OR HIGHER . *CHILLS OR SWEATING . *NAUSEA AND VOMITING THAT IS NOT CONTROLLED WITH YOUR NAUSEA MEDICATION . *UNUSUAL SHORTNESS OF BREATH . *UNUSUAL BRUISING OR BLEEDING . *URINARY PROBLEMS (pain or burning when urinating, or frequent urination) . *BOWEL PROBLEMS (unusual diarrhea, constipation, pain near the anus) . TENDERNESS IN MOUTH AND THROAT WITH OR WITHOUT PRESENCE OF ULCERS (sore throat, sores in mouth, or a toothache) . UNUSUAL RASH, SWELLING OR PAIN  . UNUSUAL VAGINAL DISCHARGE OR ITCHING   Items with * indicate a potential emergency and should be followed up as soon as possible or go to the Emergency Department if any problems should occur.  Please show the CHEMOTHERAPY ALERT CARD or IMMUNOTHERAPY ALERT CARD at check-in to the Emergency  Department and triage nurse.  Should you have questions after your visit or need to cancel or reschedule your appointment, please contact [facility]  [phone] and follow the prompts.  Office hours are 8:00 a.m. to 4:30 p.m. Monday - Friday. Please note that voicemails left after 4:00 p.m. may not be returned until the following business day.  We are closed weekends and major holidays. You have access to a nurse at all times for urgent questions. Please call the main number to the clinic [phone] and follow the prompts.  For any non-urgent questions, you may also contact your provider using MyChart. We now offer e-Visits for anyone 18 and older to request care online for non-urgent symptoms. For details visit [email or web address].   Also download the MyChart app! Go to the app store, search "MyChart", open the app, select [facility], and log in with your MyChart username and password.  Due to Covid, a mask is required upon entering the hospital/clinic. If you do not have a mask, one will be given to you upon arrival. For doctor visits, patients may have 1 support person aged 18 or older with them. For treatment visits, patients cannot have anyone with them due to current Covid guidelines and our immunocompromised population.  

## 2021-05-11 ENCOUNTER — Inpatient Hospital Stay: Payer: BC Managed Care – PPO

## 2021-05-11 VITALS — BP 108/67 | HR 73 | Temp 96.8°F | Resp 16

## 2021-05-11 DIAGNOSIS — Z79899 Other long term (current) drug therapy: Secondary | ICD-10-CM | POA: Diagnosis not present

## 2021-05-11 DIAGNOSIS — Z800 Family history of malignant neoplasm of digestive organs: Secondary | ICD-10-CM | POA: Diagnosis not present

## 2021-05-11 DIAGNOSIS — I10 Essential (primary) hypertension: Secondary | ICD-10-CM | POA: Diagnosis not present

## 2021-05-11 DIAGNOSIS — E611 Iron deficiency: Secondary | ICD-10-CM

## 2021-05-11 DIAGNOSIS — D508 Other iron deficiency anemias: Secondary | ICD-10-CM | POA: Diagnosis not present

## 2021-05-11 DIAGNOSIS — Z8041 Family history of malignant neoplasm of ovary: Secondary | ICD-10-CM | POA: Diagnosis not present

## 2021-05-11 DIAGNOSIS — Z87891 Personal history of nicotine dependence: Secondary | ICD-10-CM | POA: Diagnosis not present

## 2021-05-11 DIAGNOSIS — K909 Intestinal malabsorption, unspecified: Secondary | ICD-10-CM | POA: Diagnosis not present

## 2021-05-11 DIAGNOSIS — Z8249 Family history of ischemic heart disease and other diseases of the circulatory system: Secondary | ICD-10-CM | POA: Diagnosis not present

## 2021-05-11 DIAGNOSIS — E785 Hyperlipidemia, unspecified: Secondary | ICD-10-CM | POA: Diagnosis not present

## 2021-05-11 DIAGNOSIS — Z9884 Bariatric surgery status: Secondary | ICD-10-CM | POA: Diagnosis not present

## 2021-05-11 MED ORDER — SODIUM CHLORIDE 0.9 % IV SOLN: 200.0000 mg | Freq: Once | INTRAVENOUS | Status: DC

## 2021-05-11 MED ORDER — SODIUM CHLORIDE 0.9 % IV SOLN
Freq: Once | INTRAVENOUS | Status: AC
  Filled 2021-05-11: qty 250

## 2021-05-11 MED ORDER — IRON SUCROSE 20 MG/ML IV SOLN
200.0000 mg | Freq: Once | INTRAVENOUS | Status: AC
  Administered 2021-05-11: 200 mg via INTRAVENOUS

## 2021-05-18 ENCOUNTER — Inpatient Hospital Stay: Payer: BC Managed Care – PPO | Attending: Internal Medicine

## 2021-05-18 VITALS — BP 114/77 | HR 60 | Temp 96.8°F | Resp 16

## 2021-05-18 DIAGNOSIS — K909 Intestinal malabsorption, unspecified: Secondary | ICD-10-CM | POA: Diagnosis not present

## 2021-05-18 DIAGNOSIS — D508 Other iron deficiency anemias: Secondary | ICD-10-CM | POA: Diagnosis not present

## 2021-05-18 DIAGNOSIS — E611 Iron deficiency: Secondary | ICD-10-CM

## 2021-05-18 DIAGNOSIS — Z9884 Bariatric surgery status: Secondary | ICD-10-CM | POA: Insufficient documentation

## 2021-05-18 DIAGNOSIS — R609 Edema, unspecified: Secondary | ICD-10-CM

## 2021-05-18 MED ORDER — SODIUM CHLORIDE 0.9 % IV SOLN
Freq: Once | INTRAVENOUS | Status: AC
  Filled 2021-05-18: qty 250

## 2021-05-18 MED ORDER — IRON SUCROSE 20 MG/ML IV SOLN
200.0000 mg | Freq: Once | INTRAVENOUS | Status: AC
Start: 2021-05-18 — End: 2021-05-18
  Administered 2021-05-18: 200 mg via INTRAVENOUS
  Filled 2021-05-18: qty 10

## 2021-05-18 MED ORDER — SODIUM CHLORIDE 0.9 % IV SOLN: 200.0000 mg | Freq: Once | INTRAVENOUS | Status: DC

## 2021-05-18 NOTE — Progress Notes (Signed)
Patient c/o RLE (primarily ankle region) edema x "a couple of days" and was wondering if it could be due to Venofer. Patient does report an injury to right knee approx. 1 month ago and has been wearing a knee brace but denies any issues with edema prior to this episode. Dr. Donneta Romberg and team made aware. Elouise Munroe, NP to see patient at chairside.

## 2021-05-18 NOTE — Progress Notes (Signed)
Was called to infusion to discuss right lower extremity edema.  Patient reports 2 days of pedal/ankle/calf edema, primarily in right lower extremity although to a lesser degree in the left as well.  Of note, she injured the right knee about a month ago and has been wearing a compressive knee brace.  She denies additional trauma or injury.  No pain.  No shortness of breath, chest pain, or other symptoms.  1+ edema right lower extremity.  Negative Homans' sign.  Discussed with Dr. Donneta Romberg and will obtain lower extremity Doppler although suspicion for DVT is low.  Patient is a non-smoker and not on oral contraception.  IV Venofer can cause peripheral edema but <10% incidence.  Patient reports that she does not want to get Doppler today.  She says that she plans to leave immediately to go out of town.  Discussed the importance of seeking ER care should symptoms worsen or she develop chest pain or shortness of breath.  Patient verbalized understanding.

## 2021-05-22 ENCOUNTER — Other Ambulatory Visit: Payer: Self-pay | Admitting: Adult Health

## 2021-05-22 DIAGNOSIS — F419 Anxiety disorder, unspecified: Secondary | ICD-10-CM

## 2021-05-22 DIAGNOSIS — G4700 Insomnia, unspecified: Secondary | ICD-10-CM

## 2021-05-24 ENCOUNTER — Ambulatory Visit: Payer: BC Managed Care – PPO | Attending: Hospice and Palliative Medicine

## 2021-05-25 ENCOUNTER — Inpatient Hospital Stay: Payer: BC Managed Care – PPO

## 2021-05-25 VITALS — BP 118/63 | HR 62 | Temp 96.0°F | Resp 18

## 2021-05-25 DIAGNOSIS — E611 Iron deficiency: Secondary | ICD-10-CM

## 2021-05-25 DIAGNOSIS — K909 Intestinal malabsorption, unspecified: Secondary | ICD-10-CM | POA: Diagnosis not present

## 2021-05-25 DIAGNOSIS — Z9884 Bariatric surgery status: Secondary | ICD-10-CM | POA: Diagnosis not present

## 2021-05-25 DIAGNOSIS — D508 Other iron deficiency anemias: Secondary | ICD-10-CM | POA: Diagnosis not present

## 2021-05-25 MED ORDER — SODIUM CHLORIDE 0.9 % IV SOLN
Freq: Once | INTRAVENOUS | Status: AC
  Filled 2021-05-25: qty 250

## 2021-05-25 MED ORDER — IRON SUCROSE 20 MG/ML IV SOLN
200.0000 mg | Freq: Once | INTRAVENOUS | Status: AC
  Administered 2021-05-25: 200 mg via INTRAVENOUS
  Filled 2021-05-25: qty 10

## 2021-05-25 MED ORDER — SODIUM CHLORIDE 0.9 % IV SOLN: 200.0000 mg | Freq: Once | INTRAVENOUS | Status: DC

## 2021-05-25 NOTE — Patient Instructions (Signed)
[facility]  Discharge Instructions: Thank you for choosing [facility] to provide your oncology and hematology care.  If you have a lab appointment with the Cancer Center, please go directly to the Cancer Center and check in at the registration area.  Wear comfortable clothing and clothing appropriate for easy access to any Portacath or PICC line.   We strive to give you quality time with your provider. You may need to reschedule your appointment if you arrive late (15 or more minutes).  Arriving late affects you and other patients whose appointments are after yours.  Also, if you miss three or more appointments without notifying the office, you may be dismissed from the clinic at the provider's discretion.      For prescription refill requests, have your pharmacy contact our office and allow 72 hours for refills to be completed.    Today you received Venofer   To help prevent nausea and vomiting after your treatment, we encourage you to take your nausea medication as directed.  BELOW ARE SYMPTOMS THAT SHOULD BE REPORTED IMMEDIATELY: . *FEVER GREATER THAN 100.4 F (38 C) OR HIGHER . *CHILLS OR SWEATING . *NAUSEA AND VOMITING THAT IS NOT CONTROLLED WITH YOUR NAUSEA MEDICATION . *UNUSUAL SHORTNESS OF BREATH . *UNUSUAL BRUISING OR BLEEDING . *URINARY PROBLEMS (pain or burning when urinating, or frequent urination) . *BOWEL PROBLEMS (unusual diarrhea, constipation, pain near the anus) . TENDERNESS IN MOUTH AND THROAT WITH OR WITHOUT PRESENCE OF ULCERS (sore throat, sores in mouth, or a toothache) . UNUSUAL RASH, SWELLING OR PAIN  . UNUSUAL VAGINAL DISCHARGE OR ITCHING   Items with * indicate a potential emergency and should be followed up as soon as possible or go to the Emergency Department if any problems should occur.  Please show the CHEMOTHERAPY ALERT CARD or IMMUNOTHERAPY ALERT CARD at check-in to the Emergency Department and triage  nurse.  Should you have questions after your visit or need to cancel or reschedule your appointment, please contact [facility]  [phone] and follow the prompts.  Office hours are 8:00 a.m. to 4:30 p.m. Monday - Friday. Please note that voicemails left after 4:00 p.m. may not be returned until the following business day.  We are closed weekends and major holidays. You have access to a nurse at all times for urgent questions. Please call the main number to the clinic [phone] and follow the prompts.  For any non-urgent questions, you may also contact your provider using MyChart. We now offer e-Visits for anyone 18 and older to request care online for non-urgent symptoms. For details visit [email or web address].   Also download the MyChart app! Go to the app store, search "MyChart", open the app, select [facility], and log in with your MyChart username and password.  Due to Covid, a mask is required upon entering the hospital/clinic. If you do not have a mask, one will be given to you upon arrival. For doctor visits, patients may have 1 support person aged 18 or older with them. For treatment visits, patients cannot have anyone with them due to current Covid guidelines and our immunocompromised population.  

## 2021-05-29 ENCOUNTER — Other Ambulatory Visit: Payer: Self-pay | Admitting: Adult Health

## 2021-05-29 DIAGNOSIS — F419 Anxiety disorder, unspecified: Secondary | ICD-10-CM

## 2021-05-29 DIAGNOSIS — G4700 Insomnia, unspecified: Secondary | ICD-10-CM

## 2021-05-29 MED ORDER — TRAZODONE HCL 50 MG PO TABS: 25.0000 mg | ORAL_TABLET | Freq: Every evening | ORAL | 0 refills | Status: DC | PRN

## 2021-05-31 ENCOUNTER — Ambulatory Visit: Payer: BC Managed Care – PPO | Admitting: Internal Medicine

## 2021-06-06 ENCOUNTER — Encounter: Payer: Self-pay | Admitting: Adult Health

## 2021-06-07 NOTE — Telephone Encounter (Signed)
Can I put her in the 3:15 spot ? Her husband has to bring her and she has to wait until he gets home

## 2021-06-12 ENCOUNTER — Ambulatory Visit: Payer: BC Managed Care – PPO | Admitting: Family

## 2021-06-12 ENCOUNTER — Ambulatory Visit (INDEPENDENT_AMBULATORY_CARE_PROVIDER_SITE_OTHER): Payer: BC Managed Care – PPO

## 2021-06-12 ENCOUNTER — Other Ambulatory Visit: Payer: Self-pay

## 2021-06-12 ENCOUNTER — Other Ambulatory Visit: Payer: Self-pay | Admitting: Family

## 2021-06-12 ENCOUNTER — Encounter: Payer: Self-pay | Admitting: Family

## 2021-06-12 VITALS — BP 140/88 | HR 81 | Temp 97.5°F | Ht 67.99 in | Wt 196.6 lb

## 2021-06-12 DIAGNOSIS — M25361 Other instability, right knee: Secondary | ICD-10-CM

## 2021-06-12 DIAGNOSIS — M25561 Pain in right knee: Secondary | ICD-10-CM

## 2021-06-12 MED ORDER — HYDROCODONE-ACETAMINOPHEN 5-325 MG PO TABS: 1.0000 | ORAL_TABLET | Freq: Two times a day (BID) | ORAL | 0 refills | Status: AC | PRN

## 2021-06-13 NOTE — Progress Notes (Signed)
Acute Office Visit  Subjective:    Patient ID: Kaitlyn Schmitt, female    DOB: [birth year], 40 y.o.   MRN: [id number]  Chief Complaint  Patient presents with  . Knee Pain    Pt states she is having pain in her knee that stated a few months ago. Pt states her knee is popping in and out of place and feels like its stretching in the back of her knee. Pt states she has had swelling in her knee.    HPI Patient is in today with c/o persistent right knee pain and swelling after sustaining injury due to a fall 3 months ago. Patient has been taking Voltaren, exercising, applying Bengay, bracing the knee that has not helped with stability and pain. Patient reports that her knee appears to pop in and out of socket. Pain is worse with walking. Rates pain 8/10. Describes a pulling sensation behind her right knee.   Past Medical History:  Diagnosis Date  . GERD (gastroesophageal reflux disease)   . Hx of migraines   . Hyperlipidemia   . Hypertension     Past Surgical History:  Procedure Laterality Date  . CHOLECYSTECTOMY  2012  . GASTRIC BYPASS  2013  . TONSILLECTOMY AND ADENOIDECTOMY  2014    Family History  Problem Relation Age of Onset  . Arthritis Father   . Hyperlipidemia Father   . Hypertension Father   . Arthritis Paternal Grandmother   . Heart disease Paternal Grandmother   . Hypertension Paternal Grandmother   . Diabetes Paternal Grandmother   . Ovarian cancer Maternal Grandmother 60  . Rectal cancer Maternal Grandfather 19    Social History   Socioeconomic History  . Marital status: Single    Spouse name: Not on file  . Number of children: 1  . Years of education: 60  . Highest education level: Not on file  Occupational History  . Occupation: Interior and spatial designer: [employer]  Tobacco Use  . Smoking status: Former    Types: Cigarettes    Quit date: 05/18/2014    Years since quitting: 7.0  . Smokeless tobacco: Never  Vaping Use  . Vaping Use: Never used   Substance and Sexual Activity  . Alcohol use: Yes    Alcohol/week: 0.0 standard drinks    Comment: socially  . Drug use: No  . Sexual activity: Yes  Other Topics Concern  . Not on file  Social History Narrative   Kaitlyn Schmitt grew up in [place]. She is single and has a son Kaitlyn Schmitt). They have a dog (Zoe). She works in Press photographer at [employer]. She spends most of her spare time with her son. She enjoys outdoor activities.      Caffeine - 20 oz daily   Exercise - Daily walking.          Social Determinants of Health   Financial Resource Strain: Not on file  Food Insecurity: Not on file  Transportation Needs: Not on file  Physical Activity: Not on file  Stress: Not on file  Social Connections: Not on file  Intimate Partner Violence: Not on file    Outpatient Medications Prior to Visit  Medication Sig Dispense Refill  . omeprazole (PRILOSEC) 10 MG capsule Take 2 capsules (20 mg total) by mouth daily. Reported on 03/27/2016 90 capsule 2  . sertraline (ZOLOFT) 100 MG tablet Take 1 tablet (100 mg total) by mouth daily. 90 tablet 0  . tiZANidine (ZANAFLEX) 2 MG tablet  Take 2-4 mg by mouth at bedtime as needed.    . traZODone (DESYREL) 50 MG tablet Take 0.5-1 tablets (25-50 mg total) by mouth at bedtime as needed for sleep. 30 tablet 0  . HYDROcodone-acetaminophen (NORCO/VICODIN) 5-325 MG tablet Take 1 tablet by mouth 2 (two) times daily as needed.    . hydrOXYzine (ATARAX/VISTARIL) 10 MG tablet Take 1 tablet (10 mg total) by mouth 3 (three) times daily as needed (will cause drowsiness). (Patient not taking: Reported on 04/26/2021) 90 tablet 0  . Iron, Ferrous Sulfate, 325 (65 Fe) MG TABS Take 325 mg by mouth every other day. (Patient not taking: Reported on 04/26/2021) 90 tablet 0  . ondansetron (ZOFRAN) 4 MG tablet Take 1 tablet (4 mg total) by mouth every 8 (eight) hours as needed for nausea. (Patient not taking: Reported on 04/26/2021) 20 tablet 0   No facility-administered medications  prior to visit.    No Known Allergies  Review of Systems  Constitutional: Negative.   Respiratory: Negative.    Gastrointestinal: Negative.   Musculoskeletal:  Positive for arthralgias, gait problem and joint swelling.       Right knee pain  Skin: Negative.   Allergic/Immunologic: Negative.   Neurological:  Negative for numbness.  Psychiatric/Behavioral: Negative.    All other systems reviewed and are negative.     Objective:    Physical Exam Vitals and nursing note reviewed.  Constitutional:      Appearance: Normal appearance.  Cardiovascular:     Rate and Rhythm: Normal rate and regular rhythm.     Pulses: Normal pulses.     Heart sounds: Normal heart sounds.  Pulmonary:     Effort: Pulmonary effort is normal.     Breath sounds: Normal breath sounds.  Musculoskeletal:     Cervical back: Normal range of motion and neck supple.       Legs:     Comments: Pain to palpation. Mild swelling. Patella aligned. Mild pain with flexion and extension.   Skin:    General: Skin is warm and dry.  Neurological:     General: No focal deficit present.     Mental Status: She is alert and oriented to person, place, and time.     Cranial Nerves: No cranial nerve deficit.     Sensory: No sensory deficit.     Coordination: Coordination normal.  Psychiatric:        Mood and Affect: Mood normal.        Behavior: Behavior normal.   BP 140/88   Pulse 81   Temp (!) 97.5 F (36.4 C)   Ht 5' 7.99" (1.727 m)   Wt 196 lb 9.6 oz (89.2 kg)   LMP 06/05/2021   SpO2 99%   BMI 29.90 kg/m  Wt Readings from Last 3 Encounters:  06/12/21 196 lb 9.6 oz (89.2 kg)  04/26/21 194 lb 3.2 oz (88.1 kg)  04/24/21 191 lb 3.2 oz (86.7 kg)    Health Maintenance Due  Topic Date Due  . Pneumococcal Vaccine 39-52 Years old (1 - PCV) Never done  . HIV Screening  Never done  . Hepatitis C Screening  Never done  . PAP SMEAR-Modifier  09/30/2016  . COVID-19 Vaccine (3 - Moderna risk series) 04/26/2020     There are no preventive care reminders to display for this patient.   Lab Results  Component Value Date   TSH 0.557 04/02/2021   Lab Results  Component Value Date   WBC 11.0 (H) 04/02/2021  HGB 9.0 (L) 04/02/2021   HCT 29.9 (L) 04/02/2021   MCV 73 (L) 04/02/2021   PLT 415 04/02/2021   Lab Results  Component Value Date   NA 141 04/02/2021   K 4.2 04/02/2021   CO2 21 04/02/2021   GLUCOSE 76 04/02/2021   BUN 15 04/02/2021   CREATININE 0.93 04/02/2021   BILITOT 0.4 04/02/2021   ALKPHOS 55 04/02/2021   AST 17 04/02/2021   ALT 13 04/02/2021   PROT 7.5 04/02/2021   ALBUMIN 4.6 04/02/2021   CALCIUM 9.9 04/02/2021   ANIONGAP 7 07/29/2012   EGFR 80 04/02/2021   Lab Results  Component Value Date   CHOL 156 03/27/2016   Lab Results  Component Value Date   HDL 36 (L) 03/27/2016   Lab Results  Component Value Date   LDLCALC 103 (H) 03/27/2016   Lab Results  Component Value Date   TRIG 84 03/27/2016   Lab Results  Component Value Date   CHOLHDL 4.3 03/27/2016   No results found for: HGBA1C     Assessment & Plan:   Problem List Items Addressed This Visit   None Visit Diagnoses     Acute pain of right knee    -  Primary   Relevant Orders   DG Knee Complete 4 Views Right   Ambulatory referral to Orthopedic Surgery   Instability of right knee joint       Relevant Orders   DG Knee Complete 4 Views Right   Ambulatory referral to Orthopedic Surgery        Meds ordered this encounter  Medications  . HYDROcodone-acetaminophen (NORCO/VICODIN) 5-325 MG tablet    Sig: Take 1 tablet by mouth 2 (two) times daily as needed.    Dispense:  30 tablet    Refill:  0   Plan: Continue bracing the knee, NSAIDS, ice and pain medication prescribed to help with severe pain. Patient had an xray today. Will notify pending results. Refer to Ortho. Patient likely needs a MRI due to instability and persistent pain.   Kennyth Arnold, FNP

## 2021-06-21 ENCOUNTER — Other Ambulatory Visit: Payer: Self-pay | Admitting: Internal Medicine

## 2021-06-21 DIAGNOSIS — G4700 Insomnia, unspecified: Secondary | ICD-10-CM

## 2021-06-21 DIAGNOSIS — F419 Anxiety disorder, unspecified: Secondary | ICD-10-CM

## 2021-06-22 DIAGNOSIS — S83004A Unspecified dislocation of right patella, initial encounter: Secondary | ICD-10-CM | POA: Diagnosis not present

## 2021-06-29 ENCOUNTER — Inpatient Hospital Stay: Payer: BC Managed Care – PPO | Admitting: Internal Medicine

## 2021-06-29 ENCOUNTER — Inpatient Hospital Stay: Payer: BC Managed Care – PPO

## 2021-07-16 ENCOUNTER — Encounter: Payer: Self-pay | Admitting: Adult Health

## 2021-07-16 ENCOUNTER — Other Ambulatory Visit: Payer: Self-pay | Admitting: Internal Medicine

## 2021-07-16 DIAGNOSIS — F419 Anxiety disorder, unspecified: Secondary | ICD-10-CM

## 2021-07-16 DIAGNOSIS — G4700 Insomnia, unspecified: Secondary | ICD-10-CM

## 2021-07-26 ENCOUNTER — Ambulatory Visit: Payer: BC Managed Care – PPO | Admitting: Adult Health

## 2021-08-27 ENCOUNTER — Encounter: Payer: Self-pay | Admitting: Adult Health

## 2021-08-27 ENCOUNTER — Other Ambulatory Visit: Payer: Self-pay | Admitting: Family

## 2021-08-27 DIAGNOSIS — F419 Anxiety disorder, unspecified: Secondary | ICD-10-CM

## 2021-08-27 DIAGNOSIS — G4700 Insomnia, unspecified: Secondary | ICD-10-CM

## 2021-08-27 MED ORDER — TRAZODONE HCL 50 MG PO TABS: ORAL_TABLET | ORAL | 1 refills | Status: DC

## 2021-09-13 ENCOUNTER — Other Ambulatory Visit: Payer: Self-pay | Admitting: Internal Medicine

## 2021-09-13 DIAGNOSIS — F419 Anxiety disorder, unspecified: Secondary | ICD-10-CM

## 2022-01-13 ENCOUNTER — Other Ambulatory Visit: Payer: Self-pay | Admitting: Family

## 2022-01-13 DIAGNOSIS — G4700 Insomnia, unspecified: Secondary | ICD-10-CM

## 2022-01-13 DIAGNOSIS — F419 Anxiety disorder, unspecified: Secondary | ICD-10-CM

## 2022-05-02 DIAGNOSIS — M25561 Pain in right knee: Secondary | ICD-10-CM | POA: Diagnosis not present

## 2022-05-15 DIAGNOSIS — M25561 Pain in right knee: Secondary | ICD-10-CM | POA: Diagnosis not present

## 2022-05-27 DIAGNOSIS — S8392XA Sprain of unspecified site of left knee, initial encounter: Secondary | ICD-10-CM | POA: Diagnosis not present

## 2022-06-03 DIAGNOSIS — Z01818 Encounter for other preprocedural examination: Secondary | ICD-10-CM | POA: Diagnosis not present

## 2022-06-03 DIAGNOSIS — M25561 Pain in right knee: Secondary | ICD-10-CM | POA: Diagnosis not present

## 2022-06-05 DIAGNOSIS — M94261 Chondromalacia, right knee: Secondary | ICD-10-CM | POA: Diagnosis not present

## 2022-06-05 DIAGNOSIS — M2241 Chondromalacia patellae, right knee: Secondary | ICD-10-CM | POA: Diagnosis not present

## 2022-06-05 DIAGNOSIS — S8391XA Sprain of unspecified site of right knee, initial encounter: Secondary | ICD-10-CM | POA: Diagnosis not present

## 2022-06-05 DIAGNOSIS — S83251A Bucket-handle tear of lateral meniscus, current injury, right knee, initial encounter: Secondary | ICD-10-CM | POA: Diagnosis not present

## 2022-06-24 DIAGNOSIS — M25561 Pain in right knee: Secondary | ICD-10-CM | POA: Diagnosis not present

## 2022-06-24 DIAGNOSIS — M25661 Stiffness of right knee, not elsewhere classified: Secondary | ICD-10-CM | POA: Diagnosis not present

## 2022-08-16 ENCOUNTER — Other Ambulatory Visit: Payer: Self-pay | Admitting: Internal Medicine

## 2022-08-16 DIAGNOSIS — F419 Anxiety disorder, unspecified: Secondary | ICD-10-CM

## 2022-08-19 NOTE — Telephone Encounter (Signed)
LMTCB. Pt has not been seen since 05/2021. Please schedule pt for an appt when she returns call.

## 2022-09-12 ENCOUNTER — Other Ambulatory Visit: Payer: Self-pay | Admitting: Internal Medicine

## 2022-09-12 DIAGNOSIS — F419 Anxiety disorder, unspecified: Secondary | ICD-10-CM

## 2023-06-09 ENCOUNTER — Other Ambulatory Visit: Payer: Self-pay | Admitting: Internal Medicine

## 2023-06-09 DIAGNOSIS — G4700 Insomnia, unspecified: Secondary | ICD-10-CM

## 2023-06-09 DIAGNOSIS — F419 Anxiety disorder, unspecified: Secondary | ICD-10-CM

## 2023-06-12 NOTE — Telephone Encounter (Signed)
LMTCB. Pt is overdue for an appt.

## 2023-06-14 ENCOUNTER — Other Ambulatory Visit: Payer: Self-pay | Admitting: Internal Medicine

## 2023-06-14 DIAGNOSIS — F419 Anxiety disorder, unspecified: Secondary | ICD-10-CM

## 2023-06-17 NOTE — Telephone Encounter (Signed)
Lvm for pt to call and schedule an appt for f/up

## 2023-07-12 ENCOUNTER — Other Ambulatory Visit: Payer: Self-pay | Admitting: Internal Medicine

## 2023-07-12 DIAGNOSIS — F419 Anxiety disorder, unspecified: Secondary | ICD-10-CM

## 2023-08-05 ENCOUNTER — Other Ambulatory Visit: Payer: Self-pay | Admitting: Internal Medicine

## 2023-08-05 DIAGNOSIS — F419 Anxiety disorder, unspecified: Secondary | ICD-10-CM

## 2023-08-06 ENCOUNTER — Encounter: Payer: Self-pay | Admitting: Internal Medicine

## 2023-08-12 ENCOUNTER — Ambulatory Visit: Payer: BC Managed Care – PPO | Admitting: Internal Medicine

## 2023-12-20 ENCOUNTER — Other Ambulatory Visit: Payer: Self-pay | Admitting: Internal Medicine

## 2023-12-20 DIAGNOSIS — F419 Anxiety disorder, unspecified: Secondary | ICD-10-CM

## 2024-11-02 ENCOUNTER — Other Ambulatory Visit: Payer: Self-pay | Admitting: Internal Medicine

## 2024-11-02 DIAGNOSIS — F419 Anxiety disorder, unspecified: Secondary | ICD-10-CM
